# Patient Record
Sex: Female | Born: 1970 | Race: White | Hispanic: No | Marital: Married | Smoking: Former smoker
Health system: Southern US, Community
[De-identification: ages and names within clinical notes are randomized; demographics above are authoritative.]

## PROBLEM LIST (undated history)

## (undated) ENCOUNTER — Inpatient Hospital Stay (HOSPITAL_COMMUNITY): Payer: Self-pay

## (undated) DIAGNOSIS — I471 Supraventricular tachycardia, unspecified: Secondary | ICD-10-CM

## (undated) DIAGNOSIS — R011 Cardiac murmur, unspecified: Secondary | ICD-10-CM

## (undated) DIAGNOSIS — O139 Gestational [pregnancy-induced] hypertension without significant proteinuria, unspecified trimester: Secondary | ICD-10-CM

## (undated) DIAGNOSIS — R569 Unspecified convulsions: Secondary | ICD-10-CM

## (undated) DIAGNOSIS — R002 Palpitations: Secondary | ICD-10-CM

## (undated) HISTORY — PX: TONSILLECTOMY: SUR1361

## (undated) HISTORY — DX: Palpitations: R00.2

## (undated) HISTORY — DX: Supraventricular tachycardia, unspecified: I47.10

## (undated) HISTORY — DX: Supraventricular tachycardia: I47.1

---

## 1992-08-17 DIAGNOSIS — R569 Unspecified convulsions: Secondary | ICD-10-CM

## 1992-08-17 HISTORY — DX: Unspecified convulsions: R56.9

## 2011-08-18 NOTE — L&D Delivery Note (Signed)
Delivery Note At 9:32 PM a viable female was delivered via Vaginal, Spontaneous Delivery (Presentation: Left Occiput Anterior).  APGAR: pending , ; weight . 8lbs, 15oz. Placenta status: Intact, Spontaneous.  Cord:  with the following complications: Chorioamnionitis, minimal variability during day of delivery.  NICU team was at the delivery.  Good tone and heart rate without immediate respirations after delivery.  Cord pH: pending.  Anesthesia: Epidural  Episiotomy: None Lacerations: 2nd degree perineal and sulcus Suture Repair: 3.0 vicryl rapie Est. Blood Loss (mL): 300  Mom to postpartum.  Baby to NICU.  Placenta to pathology.  Breast, bottle feeding.  BTL tomorrow.  NPO after midnight tonight.  Sonia Side 05/18/2012, 10:12 PM

## 2011-08-18 NOTE — L&D Delivery Note (Signed)
I was present for and assisted with the delivery and agree with above. Light MSF present w/ terminal mec noted at delivery.   [place], CNM 05/21/2012 4:46 PM

## 2011-10-07 ENCOUNTER — Encounter (HOSPITAL_COMMUNITY): Payer: Self-pay

## 2011-10-07 ENCOUNTER — Inpatient Hospital Stay (HOSPITAL_COMMUNITY)
Admission: AD | Admit: 2011-10-07 | Discharge: 2011-10-07 | Disposition: A | Discharge status: Home / Self Care | Payer: Medicaid Other | Source: Ambulatory Visit | Attending: Family Medicine | Admitting: Family Medicine

## 2011-10-07 DIAGNOSIS — O210 Mild hyperemesis gravidarum: Secondary | ICD-10-CM | POA: Insufficient documentation

## 2011-10-07 DIAGNOSIS — O219 Vomiting of pregnancy, unspecified: Secondary | ICD-10-CM

## 2011-10-07 DIAGNOSIS — O26899 Other specified pregnancy related conditions, unspecified trimester: Secondary | ICD-10-CM

## 2011-10-07 DIAGNOSIS — R109 Unspecified abdominal pain: Secondary | ICD-10-CM

## 2011-10-07 HISTORY — DX: Cardiac murmur, unspecified: R01.1

## 2011-10-07 LAB — WET PREP, GENITAL
Trich, Wet Prep: NONE SEEN
Yeast Wet Prep HPF POC: NONE SEEN

## 2011-10-07 LAB — URINALYSIS, ROUTINE W REFLEX MICROSCOPIC
Bilirubin Urine: NEGATIVE
Glucose, UA: NEGATIVE mg/dL
Hgb urine dipstick: NEGATIVE
Specific Gravity, Urine: 1.025 (ref 1.005–1.030)
Urobilinogen, UA: 0.2 mg/dL (ref 0.0–1.0)
pH: 6 (ref 5.0–8.0)

## 2011-10-07 LAB — BASIC METABOLIC PANEL
BUN: 12 mg/dL (ref 6–23)
CO2: 25 mEq/L (ref 19–32)
Calcium: 9.3 mg/dL (ref 8.4–10.5)
Chloride: 101 mEq/L (ref 96–112)
Creatinine, Ser: 0.51 mg/dL (ref 0.50–1.10)
GFR calc Af Amer: >90 mL/min (ref >90)

## 2011-10-07 LAB — CBC
HCT: 37.4 % (ref 36.0–46.0)
MCH: 29 pg (ref 26.0–34.0)
MCV: 86.8 fL (ref 78.0–100.0)
RDW: 12.5 % (ref 11.5–15.5)
WBC: 11.1 10*3/uL — ABNORMAL HIGH (ref 4.0–10.5)

## 2011-10-07 LAB — DIFFERENTIAL
Basophils Absolute: 0 10*3/uL (ref 0.0–0.1)
Eosinophils Absolute: 0.1 10*3/uL (ref 0.0–0.7)
Eosinophils Relative: 1 % (ref 0–5)
Lymphocytes Relative: 27 % (ref 12–46)
Monocytes Absolute: 1.2 10*3/uL — ABNORMAL HIGH (ref 0.1–1.0)

## 2011-10-07 LAB — POCT PREGNANCY, URINE: Preg Test, Ur: POSITIVE — AB

## 2011-10-07 LAB — PREGNANCY, URINE: Preg Test, Ur: POSITIVE — AB

## 2011-10-07 MED ORDER — ONDANSETRON 8 MG PO TBDP: 8.0000 mg | ORAL_TABLET | Freq: Three times a day (TID) | ORAL | Status: DC | PRN

## 2011-10-07 MED ORDER — PROMETHAZINE HCL 25 MG PO TABS: 12.5000 mg | ORAL_TABLET | Freq: Four times a day (QID) | ORAL | Status: DC | PRN

## 2011-10-07 MED ORDER — ACETAMINOPHEN 325 MG PO TABS
650.0000 mg | ORAL_TABLET | Freq: Once | ORAL | Status: DC
  Filled 2011-10-07: qty 2

## 2011-10-07 MED ORDER — ONDANSETRON HCL 4 MG/2ML IJ SOLN: 4.0000 mg | Freq: Once | INTRAMUSCULAR | Status: DC

## 2011-10-07 MED ORDER — ONDANSETRON 8 MG PO TBDP
8.0000 mg | ORAL_TABLET | Freq: Once | ORAL | Status: AC
  Administered 2011-10-07: 8 mg via ORAL
  Filled 2011-10-07: qty 1

## 2011-10-07 NOTE — ED Provider Notes (Signed)
History     CSN: [id number]  Arrival date & time 10/07/11  1642   None     Chief Complaint  Patient presents with  . Nausea  . Back Pain   HPI: Connie Greer is a 41 y.o. female @ [redacted]w[redacted]d gestation who presents to [facility] for abdominal cramping in early pregnancy. She is also concerned about her age. Her last pregnancy was 18 years ago. She describes the pain as cramping like a period. She denies bleeding or vaginal discharge. The history was provided by the patient.  Past Medical History  Diagnosis Date  . No pertinent past medical history   . Heart murmur     Past Surgical History  Procedure Date  . No past surgeries     Family History  Problem Relation Age of Onset  . Hypertension Mother   . Cancer Mother   . Hypertension Father     History  Substance Use Topics  . Smoking status: Former Games developer  . Smokeless tobacco: Not on file  . Alcohol Use: No    OB History    Grav Para Term Preterm Abortions TAB SAB Ect Mult Living   2 1 1  0 0 0 0 0 0 1      Review of Systems  Constitutional: Positive for chills, appetite change and fatigue. Negative for fever and diaphoresis.  HENT: Positive for congestion. Negative for ear pain, sore throat, facial swelling, neck pain, neck stiffness, dental problem and sinus pressure.   Eyes: Negative for photophobia, pain and discharge.  Respiratory: Negative for cough, chest tightness and wheezing.   Cardiovascular: Negative.   Gastrointestinal: Positive for nausea, vomiting and abdominal pain. Negative for diarrhea, constipation and abdominal distention.  Genitourinary: Positive for vaginal discharge. Negative for dysuria, urgency, frequency, flank pain, vaginal bleeding, difficulty urinating and dyspareunia.  Musculoskeletal: Positive for back pain. Negative for myalgias and gait problem.  Skin: Positive for rash (left leg). Negative for color change.  Neurological: Positive for light-headedness and headaches. Negative for dizziness,  speech difficulty, weakness and numbness.  Psychiatric/Behavioral: Negative for confusion and agitation. The patient is not nervous/anxious.     Allergies  Review of patient's allergies indicates not on file.  Home Medications  No current outpatient prescriptions on file.  BP 102/63  Pulse 76  Temp(Src) 99.3 F (37.4 C) (Oral)  Resp 16  Ht 5\' 2"  (1.575 m)  Wt 125 lb 8 oz (56.926 kg)  BMI 22.95 kg/m2  LMP 08/03/2011  Physical Exam  Nursing note and vitals reviewed. Constitutional: She is oriented to person, place, and time. She appears well-developed and well-nourished. No distress.  HENT:  Head: Normocephalic.  Eyes: EOM are normal.  Neck: Neck supple.  Cardiovascular: Normal rate.   Pulmonary/Chest: Effort normal.  Abdominal: Soft. There is no tenderness.  Genitourinary:       External genitalia with out lesions. White discharge vaginal vault, cervix closed, long, no CMT, mildly tender bilateral adnexa. Uterus approximately 10 week size.   Musculoskeletal: Normal range of motion.  Neurological: She is alert and oriented to person, place, and time. No cranial nerve deficit.  Skin: Skin is warm and dry.  Psychiatric: She has a normal mood and affect. Her behavior is normal. Judgment and thought content normal.   Results for orders placed during the hospital encounter of 10/07/11 (from the past 24 hour(s))  URINALYSIS, ROUTINE W REFLEX MICROSCOPIC     Status: Normal   Collection Time   10/07/11  5:48 PM  Component Value Range   Color, Urine YELLOW  YELLOW    APPearance CLEAR  CLEAR    Specific Gravity, Urine 1.025  1.005 - 1.030    pH 6.0  5.0 - 8.0    Glucose, UA NEGATIVE  NEGATIVE (mg/dL)   Hgb urine dipstick NEGATIVE  NEGATIVE    Bilirubin Urine NEGATIVE  NEGATIVE    Ketones, ur NEGATIVE  NEGATIVE (mg/dL)   Protein, ur NEGATIVE  NEGATIVE (mg/dL)   Urobilinogen, UA 0.2  0.0 - 1.0 (mg/dL)   Nitrite NEGATIVE  NEGATIVE    Leukocytes, UA NEGATIVE  NEGATIVE     PREGNANCY, URINE     Status: Abnormal   Collection Time   10/07/11  5:48 PM      Component Value Range   Preg Test, Ur POSITIVE (*) NEGATIVE   POCT PREGNANCY, URINE     Status: Abnormal   Collection Time   10/07/11  6:03 PM      Component Value Range   Preg Test, Ur POSITIVE (*) NEGATIVE   WET PREP, GENITAL     Status: Abnormal   Collection Time   10/07/11  6:40 PM      Component Value Range   Yeast Wet Prep HPF POC NONE SEEN  NONE SEEN    Trich, Wet Prep NONE SEEN  NONE SEEN    Clue Cells Wet Prep HPF POC FEW (*) NONE SEEN    WBC, Wet Prep HPF POC FEW (*) NONE SEEN   CBC     Status: Abnormal   Collection Time   10/07/11  6:54 PM      Component Value Range   WBC 11.1 (*) 4.0 - 10.5 (K/uL)   RBC 4.31  3.87 - 5.11 (MIL/uL)   Hemoglobin 12.5  12.0 - 15.0 (g/dL)   HCT 40.9  81.1 - 91.4 (%)   MCV 86.8  78.0 - 100.0 (fL)   MCH 29.0  26.0 - 34.0 (pg)   MCHC 33.4  30.0 - 36.0 (g/dL)   RDW 78.2  95.6 - 21.3 (%)   Platelets 311  150 - 400 (K/uL)  DIFFERENTIAL     Status: Abnormal   Collection Time   10/07/11  6:54 PM      Component Value Range   Neutrophils Relative 61  43 - 77 (%)   Neutro Abs 6.8  1.7 - 7.7 (K/uL)   Lymphocytes Relative 27  12 - 46 (%)   Lymphs Abs 3.0  0.7 - 4.0 (K/uL)   Monocytes Relative 11  3 - 12 (%)   Monocytes Absolute 1.2 (*) 0.1 - 1.0 (K/uL)   Eosinophils Relative 1  0 - 5 (%)   Eosinophils Absolute 0.1  0.0 - 0.7 (K/uL)   Basophils Relative 0  0 - 1 (%)   Basophils Absolute 0.0  0.0 - 0.1 (K/uL)  BASIC METABOLIC PANEL     Status: Abnormal   Collection Time   10/07/11  6:54 PM      Component Value Range   Sodium 134 (*) 135 - 145 (mEq/L)   Potassium 3.8  3.5 - 5.1 (mEq/L)   Chloride 101  96 - 112 (mEq/L)   CO2 25  19 - 32 (mEq/L)   Glucose, Bld 87  70 - 99 (mg/dL)   BUN 12  6 - 23 (mg/dL)   Creatinine, Ser 0.86  0.50 - 1.10 (mg/dL)   Calcium 9.3  8.4 - 57.8 (mg/dL)   GFR calc non Af Amer >90  >  90 (mL/min)   GFR calc Af Amer >90  >90 (mL/min)    Informal bedside ultrasound shows IUP with cardiac activity. Patient able to visualize heart beat. Pictures given to patient.  Positive FHT by doppler 154.  Assessment: Discomforts of pregnancy   Nausea in first trimester pregnancy  Plan:  Phenergan Rx   Start prenatal care   Pregnancy verification letter    ED Course: Dr. Emelda Fear in to talk with patient re: concerns of risk factors associated with advanced maternal age.  Procedures  MDM          Kerrie Buffalo, NP 10/07/11 1954

## 2011-10-07 NOTE — Discharge Instructions (Signed)
    ________________________________________     To schedule your Maternity Eligibility Appointment, please call [phone].  When you arrive for your appointment you must bring the following items or information listed below.  Your appointment will be rescheduled if you do not have these items or are 15 minutes late. If currently receiving Medicaid, you MUST bring: 1. Medicaid Card 2. Social Security Card 3. Picture ID 4. Proof of Pregnancy 5. Verification of current address if the address on Medicaid card is incorrect "postmarked mail" If not receiving Medicaid, you MUST bring: 1. Social Security Card 2. Picture ID 3. Birth Certificate (if available) Passport or *Green Card 4. Proof of Pregnancy 5. Verification of current address "postmarked mail" for each income presented. 6. Verification of insurance coverage, if any 7. Check stubs from each employer for the previous month (if unable to present check stub  for each week, we will accept check stub for the first and last week ill the same month.) If you can't locate check stubs, you must bring a letter from the employer(s) and it must have the following information on letterhead, typed, in English: o name of company o company telephone number o how long been with the company, if less than one month o how much person earns per hour o how many hours per week work o the gross pay the person earned for the previous month If you are 40 years old or less, you do not have to bring proof of income unless you work or live with the father of the baby and at that time we will need proof of income from you and/or the father of the baby. Green Card recipients are eligible for Medicaid for Pregnant Women (MPW)    

## 2011-10-07 NOTE — Progress Notes (Signed)
Pt states nausea since beginning of pregnancy-6 weeks. LMP-08/03/2011, denies bleeding, back pain both sides, feels like her bones hurt. Deneis abnormal vaginal d/c.

## 2011-10-07 NOTE — Progress Notes (Signed)
Pt has been having cramps for about 6 wks

## 2011-10-08 LAB — GC/CHLAMYDIA PROBE AMP, GENITAL
Chlamydia, DNA Probe: NEGATIVE
GC Probe Amp, Genital: NEGATIVE

## 2011-10-11 NOTE — ED Provider Notes (Signed)
Attestation of Attending Supervision of Advanced Practitioner: Evaluation and management procedures were performed by the PA/NP/CNM/OB Fellow under my supervision/collaboration. Chart reviewed and agree with management and plan. Risks associated with advanced mat age reviewed with patient due to patient concerns.  Tilda Burrow 10/11/2011 8:56 PM

## 2011-10-14 ENCOUNTER — Encounter (HOSPITAL_COMMUNITY): Payer: Self-pay

## 2011-10-14 ENCOUNTER — Inpatient Hospital Stay (HOSPITAL_COMMUNITY)
Admission: AD | Admit: 2011-10-14 | Discharge: 2011-10-14 | Disposition: A | Discharge status: Home / Self Care | Payer: Medicaid Other | Source: Ambulatory Visit | Attending: Obstetrics & Gynecology | Admitting: Obstetrics & Gynecology

## 2011-10-14 ENCOUNTER — Inpatient Hospital Stay (HOSPITAL_COMMUNITY): Payer: Medicaid Other

## 2011-10-14 DIAGNOSIS — O26859 Spotting complicating pregnancy, unspecified trimester: Secondary | ICD-10-CM | POA: Insufficient documentation

## 2011-10-14 DIAGNOSIS — O209 Hemorrhage in early pregnancy, unspecified: Secondary | ICD-10-CM

## 2011-10-14 DIAGNOSIS — R109 Unspecified abdominal pain: Secondary | ICD-10-CM | POA: Insufficient documentation

## 2011-10-14 DIAGNOSIS — N898 Other specified noninflammatory disorders of vagina: Secondary | ICD-10-CM | POA: Insufficient documentation

## 2011-10-14 DIAGNOSIS — Z87891 Personal history of nicotine dependence: Secondary | ICD-10-CM | POA: Insufficient documentation

## 2011-10-14 HISTORY — DX: Gestational (pregnancy-induced) hypertension without significant proteinuria, unspecified trimester: O13.9

## 2011-10-14 LAB — WET PREP, GENITAL: WBC, Wet Prep HPF POC: NONE SEEN

## 2011-10-14 LAB — URINALYSIS, ROUTINE W REFLEX MICROSCOPIC
Bilirubin Urine: NEGATIVE
Glucose, UA: NEGATIVE mg/dL
Ketones, ur: NEGATIVE mg/dL
pH: 6 (ref 5.0–8.0)

## 2011-10-14 LAB — CBC
Platelets: 302 10*3/uL (ref 150–400)
RDW: 12.5 % (ref 11.5–15.5)
WBC: 10.6 10*3/uL — ABNORMAL HIGH (ref 4.0–10.5)

## 2011-10-14 MED ORDER — RHO D IMMUNE GLOBULIN 1500 UNIT/2ML IJ SOLN
300.0000 ug | Freq: Once | INTRAMUSCULAR | Status: AC
  Administered 2011-10-14: 300 ug via INTRAMUSCULAR
  Filled 2011-10-14: qty 2

## 2011-10-14 NOTE — ED Provider Notes (Signed)
Attestation of Attending Supervision of Advanced Practitioner: Evaluation and management procedures were performed by the PA/NP/CNM/OB Fellow under my supervision/collaboration. Chart reviewed, and agree with management and plan.  Jaynie Collins, M.D. 10/14/2011 1:32 PM

## 2011-10-14 NOTE — ED Provider Notes (Signed)
History   Pt presents today c/o vag spotting for the past couple of days. She states her last episode of intercourse was on Saturday. She also c/o mild lower abd pain. She denies vag dc or irritation.  Chief Complaint  Patient presents with  . Abdominal Pain   HPI  OB History    Grav Para Term Preterm Abortions TAB SAB Ect Mult Living   2 1 0 1 0 0 0 0 0 1       Past Medical History  Diagnosis Date  . Heart murmur   . Pregnancy induced hypertension   . Preterm labor     Past Surgical History  Procedure Date  . Tonsillectomy     Family History  Problem Relation Age of Onset  . Hypertension Mother   . Cancer Mother   . Hypertension Father   . Anesthesia problems Neg Hx     History  Substance Use Topics  . Smoking status: Former Games developer  . Smokeless tobacco: Never Used  . Alcohol Use: No    Allergies: No Known Allergies  Prescriptions prior to admission  Medication Sig Dispense Refill  . acetaminophen (TYLENOL) 500 MG tablet Take 1,000 mg by mouth every 6 (six) hours as needed. For pain      . ondansetron (ZOFRAN ODT) 8 MG disintegrating tablet Take 1 tablet (8 mg total) by mouth every 8 (eight) hours as needed for nausea.  20 tablet  0  . Prenatal Vit-Fe Fumarate-FA (PRENATAL MULTIVITAMIN) TABS Take 1 tablet by mouth daily.      . promethazine (PHENERGAN) 25 MG tablet Take 0.5 tablets (12.5 mg total) by mouth every 6 (six) hours as needed for nausea.  20 tablet  0    Review of Systems  Constitutional: Negative for fever and chills.  Eyes: Negative for blurred vision and double vision.  Cardiovascular: Negative for chest pain and palpitations.  Gastrointestinal: Positive for abdominal pain. Negative for nausea, vomiting, diarrhea and constipation.  Genitourinary: Negative for dysuria, urgency, frequency and hematuria.  Neurological: Negative for dizziness and headaches.  Psychiatric/Behavioral: Negative for depression and suicidal ideas.   Physical Exam    Blood pressure 90/58, pulse 86, temperature 98.4 F (36.9 C), temperature source Oral, resp. rate 16, height 5\' 2"  (1.575 m), weight 126 lb 9.6 oz (57.425 kg), last menstrual period 08/03/2011, SpO2 100.00%.  Physical Exam  Nursing note and vitals reviewed. Constitutional: She is oriented to person, place, and time. She appears well-developed and well-nourished. No distress.  HENT:  Head: Normocephalic and atraumatic.  Eyes: EOM are normal. Pupils are equal, round, and reactive to light.  GI: Soft. She exhibits no distension and no mass. There is no tenderness. There is no rebound and no guarding.  Genitourinary: No bleeding around the vagina. Vaginal discharge found.       Cervix Lg/closed. Uterus 10wks size and slightly tender to palpation. No adnexal masses. No bleeding noted on exam.  Neurological: She is alert and oriented to person, place, and time.  Skin: Skin is warm and dry. She is not diaphoretic.  Psychiatric: She has a normal mood and affect. Her behavior is normal. Judgment and thought content normal.    [facility] Course  Procedures  Wet prep and GC/Chalmydia cultures done.  Results for orders placed during the hospital encounter of 10/14/11 (from the past 24 hour(s))  URINALYSIS, ROUTINE W REFLEX MICROSCOPIC     Status: Normal   Collection Time   10/14/11  9:05 AM  Component Value Range   Color, Urine YELLOW  YELLOW    APPearance CLEAR  CLEAR    Specific Gravity, Urine 1.025  1.005 - 1.030    pH 6.0  5.0 - 8.0    Glucose, UA NEGATIVE  NEGATIVE (mg/dL)   Hgb urine dipstick NEGATIVE  NEGATIVE    Bilirubin Urine NEGATIVE  NEGATIVE    Ketones, ur NEGATIVE  NEGATIVE (mg/dL)   Protein, ur NEGATIVE  NEGATIVE (mg/dL)   Urobilinogen, UA 0.2  0.0 - 1.0 (mg/dL)   Nitrite NEGATIVE  NEGATIVE    Leukocytes, UA NEGATIVE  NEGATIVE   WET PREP, GENITAL     Status: Abnormal   Collection Time   10/14/11  9:35 AM      Component Value Range   Yeast Wet Prep HPF POC NONE SEEN  NONE  SEEN    Trich, Wet Prep NONE SEEN  NONE SEEN    Clue Cells Wet Prep HPF POC FEW (*) NONE SEEN    WBC, Wet Prep HPF POC NONE SEEN  NONE SEEN   CBC     Status: Abnormal   Collection Time   10/14/11  9:40 AM      Component Value Range   WBC 10.6 (*) 4.0 - 10.5 (K/uL)   RBC 4.31  3.87 - 5.11 (MIL/uL)   Hemoglobin 12.7  12.0 - 15.0 (g/dL)   HCT 21.3  08.6 - 57.8 (%)   MCV 87.2  78.0 - 100.0 (fL)   MCH 29.5  26.0 - 34.0 (pg)   MCHC 33.8  30.0 - 36.0 (g/dL)   RDW 46.9  62.9 - 52.8 (%)   Platelets 302  150 - 400 (K/uL)    US shows single IUP at 10.2wks with good cardiac activity. Small subchorionic hemorrhage noted. Assessment and Plan  Vag bleeding in preg: discussed with pt at length. Pt to receive rhophylac injection prior to dc. She will f/u with her OB provider. Discussed diet, activity, risks, and precautions.  Clinton Gallant. Sharron Petruska III, DrHSc, MPAS, PA-C  10/14/2011, 9:44 AM   Henrietta Hoover, PA 10/14/11 [phone]

## 2011-10-14 NOTE — Discharge Instructions (Signed)
Vaginal Bleeding During Pregnancy, First Trimester A small amount of bleeding (spotting) is relatively common in early pregnancy. It usually stops on its own. There are many causes for bleeding or spotting in early pregnancy. Some bleeding may be related to the pregnancy and some may not. Cramping with the bleeding is more serious and concerning. Tell your caregiver if you have any vaginal bleeding.  CAUSES   It is normal in most cases.   The pregnancy ends (miscarriage).   The pregnancy may end (threatened miscarriage).   Infection or inflammation of the cervix.   Growths (polyps) on the cervix.   Pregnancy happens outside of the uterus and in a fallopian tube (tubal pregnancy).   Many tiny cysts in the uterus instead of pregnancy tissue (molar pregnancy).  SYMPTOMS  Vaginal bleeding or spotting with or without cramps. DIAGNOSIS  To evaluate the pregnancy, your caregiver may:  Do a pelvic exam.   Take blood tests.   Do an ultrasound.  It is very important to follow your caregiver's instructions.  TREATMENT   Evaluation of the pregnancy with blood tests and ultrasound.   Bed rest (getting up to use the bathroom only).   Rho-gam immunization if the mother is Rh negative and the father is Rh positive.  HOME CARE INSTRUCTIONS   If your caregiver orders bed rest, you may need to make arrangements for the care of other children and for other responsibilities. However, your caregiver may allow you to continue light activity.   Keep track of the number of pads you use each day, how often you change pads and how soaked (saturated) they are. Write this down.   Do not use tampons. Do not douche.   Do not have sexual intercourse or orgasms until approved by your physician.   Save any tissue that you pass for your caregiver to see.   Take medicine for cramps only with your caregiver's permission.   Do not take aspirin because it can make you bleed.  SEEK IMMEDIATE MEDICAL CARE  IF:   You experience severe cramps in your stomach, back or belly (abdomen).   You have an oral temperature above 102 F (38.9 C), not controlled by medicine.   You pass large clots or tissue.   Your bleeding increases or you become light-headed, weak or have fainting episodes.   You develop chills.   You are leaking or have a gush of fluid from your vagina.   You pass out while having a bowel movement. That may mean you have a ruptured tubal pregnancy.  Document Released: 05/13/2005 Document Revised: 04/15/2011 Document Reviewed: 11/22/2008 Exit[employer] Patient Information 2012 [employer]. 

## 2011-10-14 NOTE — Progress Notes (Signed)
Patient states that she has not been feeling well for the entire pregnancy, has nausea but no vomiting, lower abdominal and low back cramping and started spotting this am. Patient is not wearing a pad and states bright red spotting only with wiping.

## 2011-10-14 NOTE — Progress Notes (Signed)
Pt states noted spotting around 0530, only with wiping, having lower abd cramping as well. Last intercourse this weekend. Denies uti s/s.

## 2011-10-14 NOTE — ED Notes (Signed)
No adverse effect from Rhophylac. Info pamphlet given.

## 2011-10-15 LAB — GC/CHLAMYDIA PROBE AMP, GENITAL
Chlamydia, DNA Probe: NEGATIVE
GC Probe Amp, Genital: NEGATIVE

## 2011-10-15 LAB — RH IG WORKUP (INCLUDES ABO/RH)
ABO/RH(D): B NEG
Antibody Screen: NEGATIVE
Gestational Age(Wks): 10.2

## 2011-10-22 ENCOUNTER — Inpatient Hospital Stay (HOSPITAL_COMMUNITY)
Admission: AD | Admit: 2011-10-22 | Discharge: 2011-10-22 | Disposition: A | Discharge status: Home / Self Care | Payer: Medicaid Other | Source: Ambulatory Visit | Attending: Obstetrics & Gynecology | Admitting: Obstetrics & Gynecology

## 2011-10-22 ENCOUNTER — Encounter (HOSPITAL_COMMUNITY): Payer: Self-pay

## 2011-10-22 DIAGNOSIS — O200 Threatened abortion: Secondary | ICD-10-CM | POA: Insufficient documentation

## 2011-10-22 NOTE — Progress Notes (Signed)
Pt states felt a gush of blood while at work. States no cramping with the bleeding. Bright red blood, no clots.

## 2011-10-22 NOTE — Discharge Instructions (Signed)
Pelvic Rest Pelvic rest is sometimes recommended for women when:   The placenta is partially or completely covering the opening of the cervix (placenta previa).   There is bleeding between the uterine wall and the amniotic sac in the first trimester (subchorionic hemorrhage).   The cervix begins to open without labor starting (incompetent cervix, cervical insufficiency).   The labor is too early (preterm labor).  HOME CARE INSTRUCTIONS  Do not have sexual intercourse, stimulation, or an orgasm.   Do not use tampons, douche, or put anything in the vagina.   Do not lift anything over 10 pounds (4.5 kg).   Avoid strenuous activity or straining your pelvic muscles.  SEEK MEDICAL CARE IF:  You have any vaginal bleeding during pregnancy. Treat this as a potential emergency.   You have cramping pain felt low in the stomach (stronger than menstrual cramps).   You notice vaginal discharge (watery, mucus, or bloody).   You have a low, dull backache.   There are regular contractions or uterine tightening.  SEEK IMMEDIATE MEDICAL CARE IF: You have vaginal bleeding and have placenta previa.  Document Released: 11/28/2010 Document Revised: 07/23/2011 Document Reviewed: 11/28/2010 [employer] Patient Information 2012 [employer].Threatened Miscarriage  A threatened miscarriage is a pregnancy that may end. It may be marked by bleeding during the first 20 weeks of pregnancy. Often, the pregnancy can continue without any more problems. You may be asked to stop:  Having sex (intercourse).   Having orgasms.   Using tampons.   Exercising.   Doing heavy physical activity and work.  HOME CARE   Your doctor may tell you to take bed rest and to stop activities and work.   Write down the number of pads you use each day. Write down how often you change pads. Write down how soaked they are.   Follow your doctor's advice for follow-up visits and tests.   If your blood type is Rh-negative  and the father's blood is Rh-positive (or is not known), you may get a shot to protect the baby.   If you have a miscarriage, save all the tissue you pass in a container. Take the container to your doctor.  GET HELP RIGHT AWAY IF:   You have bad cramps or pain in your belly (abdomen), lower belly, or back.   You have a fever or chills.   Your bleeding gets worse or you pass large clots of blood or tissue. Save this tissue to show your doctor.   You feel lightheaded, weak, dizzy, or pass out (faint).   You have a gush of fluid from your vagina.  MAKE SURE YOU:   Understand these instructions.   Will watch your condition.   Will get help right away if you are not doing well or get worse.  Document Released: 07/16/2008 Document Revised: 07/23/2011 Document Reviewed: 08/19/2009 Exit[employer] Patient Information 2012 [employer]. 

## 2011-10-22 NOTE — ED Provider Notes (Signed)
History     CSN: [id number]  Arrival date & time 10/22/11  1331   None     No chief complaint on file.   HPI Connie Greer is a 41 y.o. female who presents to [facility] for vaginal bleeding. Was evaluated here last week for same but today bleeding has increased. Mild cramping in lower abdomen and back. Had Rhogam at last visit re: Rh negative.  Past Medical History  Diagnosis Date  . Heart murmur   . Pregnancy induced hypertension   . Preterm labor     Past Surgical History  Procedure Date  . Tonsillectomy     Family History  Problem Relation Age of Onset  . Hypertension Mother   . Cancer Mother   . Hypertension Father   . Anesthesia problems Neg Hx     History  Substance Use Topics  . Smoking status: Former Games developer  . Smokeless tobacco: Never Used  . Alcohol Use: No    OB History    Grav Para Term Preterm Abortions TAB SAB Ect Mult Living   2 1 0 1 0 0 0 0 0 1       Review of Systems  Constitutional: Negative for fever, chills, diaphoresis and fatigue.  HENT: Negative for ear pain, congestion, sore throat, facial swelling, neck pain, neck stiffness, dental problem and sinus pressure.   Eyes: Negative for photophobia, pain and discharge.  Respiratory: Negative for cough, chest tightness and wheezing.   Cardiovascular: Negative.   Gastrointestinal: Positive for nausea. Negative for vomiting, abdominal pain (cramping), diarrhea, constipation and abdominal distention.  Genitourinary: Positive for vaginal bleeding. Negative for dysuria, frequency, flank pain, vaginal discharge and difficulty urinating.  Musculoskeletal: Positive for back pain. Negative for myalgias and gait problem.  Skin: Positive for rash. Negative for color change.  Neurological: Positive for headaches. Negative for dizziness, speech difficulty, weakness, light-headedness and numbness.  Psychiatric/Behavioral: Negative for confusion and agitation.    Allergies  Review of patient's allergies  indicates no known allergies.  Home Medications  No current outpatient prescriptions on file.  BP 108/67  Pulse 97  Temp(Src) 98.4 F (36.9 C) (Oral)  Resp 20  SpO2 100%  LMP 08/03/2011  Physical Exam  Nursing note and vitals reviewed. Constitutional: She is oriented to person, place, and time. She appears well-developed and well-nourished.  HENT:  Head: Normocephalic.  Eyes: EOM are normal.  Neck: Neck supple.  Cardiovascular: Normal rate.   Pulmonary/Chest: Effort normal.  Abdominal: Soft. There is no tenderness.  Genitourinary:       Small amount of blood vaginal vault. Cervix closed, long, no CMT. Uterus 12 week size.  Musculoskeletal: Normal range of motion.  Neurological: She is alert and oriented to person, place, and time. No cranial nerve deficit.  Skin: Skin is warm and dry.  Psychiatric: She has a normal mood and affect. Her behavior is normal. Judgment and thought content normal.   Assessment: Threatened AB  Plan:  Instructions to patient re pelvic rest   Start prenatal care ED Course  Procedures: Informal bedside ultrasound shows active IUP with cardiac activity. Patient able to visualize heart beat. Doppler FHT 160   MDM   [place], NP 10/22/11 1449  [place], NP 10/22/11 1454

## 2011-10-24 ENCOUNTER — Encounter (HOSPITAL_COMMUNITY): Payer: Self-pay | Admitting: *Deleted

## 2011-10-24 ENCOUNTER — Inpatient Hospital Stay (HOSPITAL_COMMUNITY): Payer: Medicaid Other

## 2011-10-24 ENCOUNTER — Inpatient Hospital Stay (HOSPITAL_COMMUNITY)
Admission: AD | Admit: 2011-10-24 | Discharge: 2011-10-24 | Disposition: A | Discharge status: Home / Self Care | Payer: Medicaid Other | Source: Ambulatory Visit | Attending: Obstetrics and Gynecology | Admitting: Obstetrics and Gynecology

## 2011-10-24 DIAGNOSIS — O209 Hemorrhage in early pregnancy, unspecified: Secondary | ICD-10-CM | POA: Insufficient documentation

## 2011-10-24 LAB — URINALYSIS, ROUTINE W REFLEX MICROSCOPIC
Glucose, UA: NEGATIVE mg/dL
Ketones, ur: NEGATIVE mg/dL
Leukocytes, UA: NEGATIVE
pH: 6 (ref 5.0–8.0)

## 2011-10-24 LAB — URINE MICROSCOPIC-ADD ON

## 2011-10-24 LAB — CBC
Hemoglobin: 12.7 g/dL (ref 12.0–15.0)
MCH: 29.6 pg (ref 26.0–34.0)
MCV: 86.2 fL (ref 78.0–100.0)
RBC: 4.29 MIL/uL (ref 3.87–5.11)

## 2011-10-24 MED ORDER — HYDROCODONE-ACETAMINOPHEN 5-500 MG PO TABS: 1.0000 | ORAL_TABLET | Freq: Four times a day (QID) | ORAL | Status: AC | PRN

## 2011-10-24 NOTE — Discharge Instructions (Signed)
Vaginal Bleeding During Pregnancy  A small amount of bleeding from the vagina can happen anytime during pregnancy. Be sure to tell your doctor about all vaginal bleeding.   HOME CARE   Get plenty of rest and sleep.   Count the number of pads you use each day. Do not use tampons.   Save any tissue you pass for your doctor to see.   Do not exercise   Do not do any heavy lifting.   Avoid going up and down stairs. If you must climb stairs, go slowly.   Do not have sex (intercourse) or orgasms until approved by your doctor.   Do not douche.   Only take medicine as told by your doctor. Do not take aspirin.   Eat healthy.   Always keep your follow-up appointments.  GET HELP RIGHT AWAY IF:    You feel the baby moving less or not moving at all.   The bleeding gets worse.   You have very painful cramps or pain in your stomach or back.   You pass large clots or anything that looks like tissue.   You have a temperature by mouth above 102 F (38.9 C).   You feel very weak.   You have chills.   You feel dizzy or pass out (faint).   You have a gush of fluid from the vagina.  MAKE SURE YOU:    Understand these instructions.   Will watch your condition.   Will get help right away if you are not doing well or get worse.  Document Released: 05/12/2008 Document Revised: 07/23/2011 Document Reviewed: 07/09/2009  ExitCare Patient Information 2012 [employer].

## 2011-10-24 NOTE — Progress Notes (Signed)
Henrietta Hoover PA in to discuss u/s results and d/c plan. Written and verbal d/c instructions given and understanding voiced

## 2011-10-24 NOTE — Progress Notes (Signed)
G2P1 at 14wks. Went to [facility] about 2345 and passed clot in toilet. Afterward only had blood on tissue when wiped

## 2011-10-24 NOTE — ED Provider Notes (Signed)
History   Pt presents today c/o increased vag bleeding and pain since around 10pm. She states she is now passing some clots and is very concerned. She denies recent intercourse. She denies fever or any other sx. She has a prior US which demonstrated a small subchorionic hemorrhage.  No chief complaint on file.  HPI  OB History    Grav Para Term Preterm Abortions TAB SAB Ect Mult Living   2 1 0 1 0 0 0 0 0 1       Past Medical History  Diagnosis Date  . Heart murmur   . Pregnancy induced hypertension   . Preterm labor     Past Surgical History  Procedure Date  . Tonsillectomy     Family History  Problem Relation Age of Onset  . Hypertension Mother   . Cancer Mother   . Hypertension Father   . Anesthesia problems Neg Hx     History  Substance Use Topics  . Smoking status: Former Games developer  . Smokeless tobacco: Never Used  . Alcohol Use: No    Allergies: No Known Allergies  Prescriptions prior to admission  Medication Sig Dispense Refill  . acetaminophen (TYLENOL) 500 MG tablet Take 1,000 mg by mouth every 6 (six) hours as needed. For pain      . ondansetron (ZOFRAN ODT) 8 MG disintegrating tablet Take 1 tablet (8 mg total) by mouth every 8 (eight) hours as needed for nausea.  20 tablet  0  . Prenatal Vit-Fe Fumarate-FA (PRENATAL MULTIVITAMIN) TABS Take 1 tablet by mouth daily.        Review of Systems  Constitutional: Negative for fever and chills.  Eyes: Negative for blurred vision and double vision.  Respiratory: Negative for cough, hemoptysis, sputum production, shortness of breath and wheezing.   Cardiovascular: Negative for chest pain and palpitations.  Gastrointestinal: Positive for abdominal pain. Negative for nausea, vomiting, diarrhea, constipation and blood in stool.  Genitourinary: Negative for dysuria, urgency, frequency and hematuria.  Neurological: Negative for dizziness and headaches.  Psychiatric/Behavioral: Negative for depression and suicidal  ideas.   Physical Exam   Blood pressure 106/65, pulse 76, temperature 98.5 F (36.9 C), temperature source Oral, resp. rate 20, height 5\' 2"  (1.575 m), weight 125 lb (56.7 kg), last menstrual period 08/03/2011.  Physical Exam  Nursing note and vitals reviewed. Constitutional: She is oriented to person, place, and time. She appears well-developed and well-nourished. No distress.  HENT:  Head: Normocephalic and atraumatic.  Eyes: EOM are normal. Pupils are equal, round, and reactive to light.  GI: Soft. She exhibits no distension and no mass. There is tenderness. There is no rebound and no guarding.  Genitourinary: There is bleeding around the vagina.       Cervix Lg/closed. Minimal amount of bright red blood noted on exam.  Neurological: She is alert and oriented to person, place, and time.  Skin: Skin is warm and dry. She is not diaphoretic.  Psychiatric: She has a normal mood and affect. Her behavior is normal. Judgment and thought content normal.    MAU Course  Procedures  Bedside US shows single IUP with cardiac activity and movement.  Results for orders placed during the hospital encounter of 10/24/11 (from the past 24 hour(s))  CBC     Status: Normal   Collection Time   10/24/11  1:04 AM      Component Value Range   WBC 10.5  4.0 - 10.5 (K/uL)   RBC 4.29  3.87 -  5.11 (MIL/uL)   Hemoglobin 12.7  12.0 - 15.0 (g/dL)   HCT 16.1  09.6 - 04.5 (%)   MCV 86.2  78.0 - 100.0 (fL)   MCH 29.6  26.0 - 34.0 (pg)   MCHC 34.3  30.0 - 36.0 (g/dL)   RDW 40.9  81.1 - 91.4 (%)   Platelets 278  150 - 400 (K/uL)   US Ob Comp Less 14 Wks  10/24/2011  *RADIOLOGY REPORT*  Clinical Data: Vaginal bleeding.  12 weeks 1 day pregnant by previous ultrasound.  OBSTETRIC <14 WK ULTRASOUND  Technique:  Transabdominal ultrasound was performed for evaluation of the gestation as well as the maternal uterus and adnexal regions.  Comparison:  10/14/2011.  Intrauterine gestational sac: Visualized/normal in  shape. Yolk sac: Visualized (small) Embryo: Visualized Cardiac Activity: Visualized Heart Rate: 149 bpm CRL:  54.2 mm  12w  1d        [place] EDC: 05/06/2012  Maternal uterus/Adnexae: Right ovarian corpus luteum cyst.  Normal appearing left ovary.  No subchorionic hemorrhage is seen at this time.  No free peritoneal fluid.  IMPRESSION: Single live intrauterine gestation with estimated gestational age of [redacted] weeks 1 day.  This represents normal growth since the previous examination.  No complicating features.  Original Report Authenticated By: Darrol Angel, M.D.     Assessment and Plan  Bleeding in preg: discussed with pt at length. Must consider as threatening to miscarry. Discussed diet, activity, risks, and precautions. Will give few lortab for pain. Discussed diet, activity, risks, and precautions.  Clinton Gallant. Maressa Apollo III, DrHSc, MPAS, PA-C  10/24/2011, 1:19 AM   Harmon [place], PA 10/24/11 7829

## 2011-10-24 NOTE — ED Notes (Signed)
Henrietta Hoover PA in to see pt. Portable, informal u/s by PA and cardiac activity present. Couple reassured.

## 2011-10-25 NOTE — ED Provider Notes (Signed)
Attestation of Attending Supervision of Advanced Practitioner: Evaluation and management procedures were performed by the PA/NP/CNM/OB Fellow under my supervision/collaboration. Chart reviewed and agree with management and plan.  Ragnar Waas V 10/25/2011 2:50 PM

## 2011-11-09 ENCOUNTER — Emergency Department (HOSPITAL_COMMUNITY): Payer: Medicaid Other

## 2011-11-09 ENCOUNTER — Other Ambulatory Visit: Payer: Self-pay

## 2011-11-09 ENCOUNTER — Emergency Department (HOSPITAL_COMMUNITY)
Admission: EM | Admit: 2011-11-09 | Discharge: 2011-11-09 | Disposition: A | Discharge status: Home / Self Care | Payer: Medicaid Other | Attending: Emergency Medicine | Admitting: Emergency Medicine

## 2011-11-09 ENCOUNTER — Encounter (HOSPITAL_COMMUNITY): Payer: Self-pay

## 2011-11-09 DIAGNOSIS — R000 Tachycardia, unspecified: Secondary | ICD-10-CM | POA: Insufficient documentation

## 2011-11-09 DIAGNOSIS — I471 Supraventricular tachycardia: Secondary | ICD-10-CM

## 2011-11-09 DIAGNOSIS — O2690 Pregnancy related conditions, unspecified, unspecified trimester: Secondary | ICD-10-CM | POA: Insufficient documentation

## 2011-11-09 LAB — BASIC METABOLIC PANEL
BUN: 10 mg/dL (ref 6–23)
GFR calc Af Amer: >90 mL/min (ref >90)
GFR calc non Af Amer: >90 mL/min (ref >90)
Potassium: 3.5 mEq/L (ref 3.5–5.1)
Sodium: 136 mEq/L (ref 135–145)

## 2011-11-09 LAB — CBC
MCHC: 34.6 g/dL (ref 30.0–36.0)
RDW: 13 % (ref 11.5–15.5)

## 2011-11-09 LAB — URINALYSIS, MICROSCOPIC ONLY
Bilirubin Urine: NEGATIVE
Nitrite: NEGATIVE
Specific Gravity, Urine: 1.014 (ref 1.005–1.030)
pH: 7 (ref 5.0–8.0)

## 2011-11-09 LAB — RAPID URINE DRUG SCREEN, HOSP PERFORMED
Barbiturates: NOT DETECTED
Cocaine: NOT DETECTED
Opiates: NOT DETECTED

## 2011-11-09 MED ORDER — SODIUM CHLORIDE 0.9 % IV SOLN
Freq: Once | INTRAVENOUS | Status: AC
  Administered 2011-11-09: 15:00:00 via INTRAVENOUS

## 2011-11-09 MED ORDER — MORPHINE SULFATE 4 MG/ML IJ SOLN
4.0000 mg | Freq: Once | INTRAMUSCULAR | Status: DC
  Filled 2011-11-09: qty 1

## 2011-11-09 MED ORDER — IOHEXOL 350 MG/ML SOLN
75.0000 mL | Freq: Once | INTRAVENOUS | Status: AC | PRN
  Administered 2011-11-09: 75 mL via INTRAVENOUS

## 2011-11-09 MED ORDER — ACETAMINOPHEN 325 MG PO TABS
650.0000 mg | ORAL_TABLET | Freq: Once | ORAL | Status: AC
  Administered 2011-11-09: 650 mg via ORAL
  Filled 2011-11-09: qty 2

## 2011-11-09 NOTE — Discharge Instructions (Signed)
Supraventricular Tachycardia  Supraventricular tachycardia (SVT) is an abnormal heart rhythm (arrhythmia) that causes the heart to beat very fast (tachycardia). This kind of fast heartbeat originates in the upper chambers of the heart (atria). SVT can cause the heart to beat greater than 100 beats per minute. SVT can have a rapid burst of heartbeats. This can start and stop suddenly without warning and is called nonsustained. SVT can also be sustained, in which the heart beats at a continuous fast rate.   CAUSES   There can be different causes of SVT. Some of these include:   Heart valve problems such as mitral valve prolapse.   An enlarged heart (hypertrophic cardiomyopathy).   Congenital heart problems.   Heart inflammation (pericarditis).   Hyperthyroidism.   Low potassium or magnesium levels.   Caffeine.   Drug use such as cocaine, methamphetamines, or stimulants.   Some over-the-counter medicines such as:   Decongestants.   Diet medicines.   Herbal medicines.  SYMPTOMS   Symptoms of SVT can vary. Symptoms depend on whether the SVT is sustained or nonsustained. You may experience:   No symptoms (asymptomatic).   An awareness of your heart beating rapidly (palpitations).   Shortness of breath.   Chest pain or pressure.  If your blood pressure drops because of the SVT, you may experience:   Fainting or near fainting.   Weakness.   Dizziness.  DIAGNOSIS   Different tests can be performed to diagnose SVT, such as:   An electrocardiogram (EKG). This is a painless test that records the electrical activity of your heart.   Holter monitor. This is a 24 hour recording of your heart rhythm. You will be given a diary. Write down all symptoms that you have and what you were doing at the time you experienced symptoms.   Arrhythmia monitor. This is a small device that your wear for several weeks. It records the heart rhythm when you have symptoms.   Echocardiogram. This is an imaging test to help detect  abnormal heart structure such as congenital abnormalities, heart valve problems, or heart enlargement.   Stress test. This test can help determine if the SVT is related to exercise.   Electrophysiology study (EPS). This is a procedure that evaluates your heart's electrical system and can help your caregiver find the cause of your SVT.  TREATMENT   Treatment of SVT depends on the symptoms, how often it recurs, and whether there are any underlying heart problems.    If symptoms are rare and no other cardiac disease is present, no treatment may be needed.   Blood work may be done to check potassium, magnesium, and thyroid hormone levels to see if they are abnormal. If these levels are abnormal, treatment to correct the problems will occur.  Medicines  Your caregiver may use oral medicines to treat SVT. These medicines are given for long-term control of SVT. Medicines may be used alone or in combination with other treatments. These medicines work to slow nerve impulses in the heart muscle. These medicines can also be used to treat high blood pressure. Some of these medicines may include:   Calcium channel blockers.   Beta blockers.   Digoxin.  Nonsurgical procedures  Nonsurgical techniques may be used if oral medicines do not work. Some examples include:   Cardioversion. This technique uses either drugs or an electrical shock to restore a normal heart rhythm.   Cardioversion drugs may be given through an intravenous (IV) line to help "reset" the   heart rhythm.   In electrical cardioversion, the caregiver shocks your heart to stop its beat for a split second. This helps to reset the heart to a normal rhythm.   Ablation. This procedure is done under mild sedation. High frequency radio wave energy is used to destroy the area of heart tissue responsible for the SVT.  HOME CARE INSTRUCTIONS    Do not smoke.   Only take medicines prescribed by your caregiver. Check with your caregiver before using over-the-counter  medicines.   Check with your caregiver about how much alcohol and caffeine (coffee, tea, colas, or chocolate) you may have.   It is very important to keep all follow-up referrals and appointments in order to properly manage this problem.  SEEK IMMEDIATE MEDICAL CARE IF:   You have dizziness.   You faint or nearly faint.   You have shortness of breath.   You have chest pain or pressure.   You have sudden nausea or vomiting.   You have profuse sweating.   You are concerned about how long your symptoms last.   You are concerned about the frequency of your SVT episodes.  If you have the above symptoms, call your local emergency services (911 in [place] immediately. Do not drive yourself to the hospital.  MAKE SURE YOU:    Understand these instructions.   Will watch your condition.   Will get help right away if you are not doing well or get worse.  Document Released: 08/03/2005 Document Revised: 07/23/2011 Document Reviewed: 11/15/2008  [employer] Patient Information 2012 [employer].

## 2011-11-09 NOTE — ED Provider Notes (Signed)
History     CSN: [id number]  Arrival date & time 11/09/11  1052   First MD Initiated Contact with Patient 11/09/11 1115      Chief Complaint  Patient presents with  . Tachycardia    SVT converted to sinus tachycardia    HPI The patient developed palpitations and generalized weakness this morning.   EMS was contacted and the patient was was given adenosine a route for noted SVT which is confirmed on strips presented to the ER.  This converted her back to normal sinus rhythm.  The patient reports she feels much better at this time.  She had no preceding chest pain or shortness of breath.  She's currently [redacted] weeks pregnant based on early first trimester ultrasound.  She's had no vaginal bleeding.  She denies abdominal pain.  At this time she is without complaints.  She has no prior history of SVT.  She has no prior history of DVT or pulmonary embolism.  Past Medical History  Diagnosis Date  . Heart murmur   . Pregnancy induced hypertension   . Preterm labor     Past Surgical History  Procedure Date  . Tonsillectomy     Family History  Problem Relation Age of Onset  . Hypertension Mother   . Cancer Mother   . Hypertension Father   . Anesthesia problems Neg Hx     History  Substance Use Topics  . Smoking status: Former Games developer  . Smokeless tobacco: Never Used  . Alcohol Use: No    OB History    Grav Para Term Preterm Abortions TAB SAB Ect Mult Living   2 1 0 1 0 0 0 0 0 1       Review of Systems  All other systems reviewed and are negative.    Allergies  Review of patient's allergies indicates no known allergies.  Home Medications   Current Outpatient Rx  Name Route Sig Dispense Refill  . ACETAMINOPHEN 500 MG PO TABS Oral Take 1,000 mg by mouth every 6 (six) hours as needed. For pain    . PRENATAL MULTIVITAMIN CH Oral Take 1 tablet by mouth daily.    Marland Kitchen ONDANSETRON 8 MG PO TBDP Oral Take 8 mg by mouth every 8 (eight) hours as needed. For nausea or vomiting       BP 114/77  Pulse 110  Temp(Src) 98.6 F (37 C) (Oral)  Resp 16  SpO2 100%  LMP 08/03/2011  Physical Exam  Nursing note and vitals reviewed. Constitutional: She is oriented to person, place, and time. She appears well-developed and well-nourished. No distress.  HENT:  Head: Normocephalic and atraumatic.  Eyes: EOM are normal.  Neck: Normal range of motion.  Cardiovascular: Normal rate, regular rhythm and normal heart sounds.   Pulmonary/Chest: Effort normal and breath sounds normal.  Abdominal: Soft. She exhibits no distension. There is no tenderness.  Musculoskeletal: Normal range of motion.  Neurological: She is alert and oriented to person, place, and time.  Skin: Skin is warm and dry.  Psychiatric: She has a normal mood and affect. Judgment normal.    ED Course  Procedures (including critical care time)   Date: 11/09/2011  Rate: 124  Rhythm: Sinus tachycardia  QRS Axis: normal  Intervals: normal  ST/T Wave abnormalities: normal  Conduction Disutrbances: none  Narrative Interpretation:   Old EKG Reviewed: No prior EKG available       Labs Reviewed  CBC - Abnormal; Notable for the following:    WBC  13.1 (*)    All other components within normal limits  URINALYSIS, WITH MICROSCOPIC - Abnormal; Notable for the following:    APPearance CLOUDY (*)    Ketones, ur 40 (*)    Squamous Epithelial / LPF FEW (*)    All other components within normal limits  BASIC METABOLIC PANEL  TROPONIN I  URINE RAPID DRUG SCREEN (HOSP PERFORMED)   Dg Chest 1 View  11/09/2011  *RADIOLOGY REPORT*  Clinical Data: Tachycardia.  CHEST - 1 VIEW  Comparison: None  Findings: The cardiac silhouette, mediastinal and hilar contours are within normal limits.  The lungs are clear except for minimal atelectasis in the right cardiophrenic angle region.  No pleural effusion or pneumothorax.  IMPRESSION: Minimal right basilar atelectasis.  No other significant pulmonary findings.  Original Report  Authenticated By: P. Loralie Champagne, M.D.   Ct Angio Chest W/cm &/or Wo Cm  11/09/2011  *RADIOLOGY REPORT*  Clinical Data: TACHYCARDIA CP  CT ANGIOGRAPHY CHEST  Technique:  Multidetector CT imaging of the chest using the standard protocol during bolus administration of intravenous contrast. Multiplanar reconstructed images including MIPs were obtained and reviewed to evaluate the vascular anatomy.  Contrast: 75mL OMNIPAQUE IOHEXOL 350 MG/ML IV SOLN  Comparison: 11/09/2011  Findings:  No enlarged supraclavicular or axillary lymph nodes.  There is no enlarged mediastinal or hilar adenopathy.  No pericardial or pleural effusions identified.  No abnormal filling defects are identified within the main pulmonary artery or its branches to suggest an acute pulmonary embolus.  There is no airspace consolidation.  Mild plate-like atelectasis noted in the left base.  Calcified granuloma is noted within the left lower lobe.  There is a 3 mm nodule in the right lower lobe, image 36.  Review of the visualized bony structures is unremarkable.  No worrisome bony abnormalities identified.  IMPRESSION:  1.  No acute pulmonary embolus. 2.  Pulmonary nodule in the right lower lobe measures 3 mm. If the patient is at high risk for bronchogenic carcinoma, follow-up chest CT at 1 year is recommended.  If the patient is at low risk, no follow-up is needed.  This recommendation follows the consensus statement: Guidelines for Management of Small Pulmonary Nodules Detected on CT Scans:  A Statement from the Fleischner Society as published in Radiology 2005; 237:395-400.  Original Report Authenticated By: Rosealee Albee, M.D.   I personally reviewed the CT scan.  The patient was informed of her 3 mm right lower lobe lung nodule and the need for a repeat CT scan in one year   1. SVT (supraventricular tachycardia)       MDM  We'll monitor the patient in the ER.  My suspicion for pulmonary embolism is very low.  Patient is  well-appearing at this time.  She is back in sinus rhythm.  She presented with sinus tachycardia however this is been resolving on its own.  Her outside strips are consistent with SVT and she did respond to adenosine by EMS.  She continued to be persistently tachycardic in the emergency department was complaining of some chest tightness this a CT angiogram was obtained to evaluate for possible pulmonary embolus.  CTA is negative for PE or other acute pathology.  The patient feels much better at this time.  Or discharge heart rate is 109.  She's been given outpatient cardiology followup numbers and she understands the importance of cardiology followup.  She was also given a copy of her EMS EKG strips to demonstrate SVT so  that she can have these for followup with her cardiologist.        Lyanne Co, MD 11/09/11 [phone]

## 2011-11-09 NOTE — ED Notes (Signed)
Patient provided [place] sandwich and tylenol for headache.  Family at bedside, patient resting quietly, vitals stable

## 2011-11-09 NOTE — ED Notes (Signed)
Patient refusing morphine, MD notified.

## 2011-11-09 NOTE — ED Notes (Signed)
PER EMS- patient from home, patient feeling palpitations, weakness, noted to be in SVT at a rate of 220's by EMS. Patient currently in sinus tachycardia at 119.  Patient alert and oriented x 4.

## 2011-11-09 NOTE — ED Notes (Signed)
Patient [redacted] weeks pregnant, FHR of 149, due September 23rd, 2013, Joy, rapid [place] RN at bedside.

## 2011-11-09 NOTE — Progress Notes (Signed)
Call received at 1045.  Arrived to [facility] ED room 17 at 1100.  Pt anxious and tearful.  Difficult to elicit information due to anxiety.  VSS.  Pt in sinus tachycardia.  G2P1   EDC 05/09/12 GA 14.0  FHT via doppler at 145.  No decelerations noted.  Abdomen soft and nontender upon palpation  Pt denies LOF, vaginal bleeding, UCs, abdominal tightening/cramping.  Pt receives [facility] in [place].   Dr Christell Constant updated re pt presence, status, OB/med hx and presenting complaint.  Pt released from [facility] standpoint by Dr Christell Constant at this time.

## 2011-11-11 ENCOUNTER — Encounter (HOSPITAL_COMMUNITY): Payer: Self-pay

## 2011-11-11 ENCOUNTER — Other Ambulatory Visit: Payer: Self-pay

## 2011-11-11 ENCOUNTER — Inpatient Hospital Stay (HOSPITAL_COMMUNITY)
Admission: AD | Admit: 2011-11-11 | Discharge: 2011-11-11 | Disposition: A | Discharge status: Home / Self Care | Payer: Medicaid Other | Source: Ambulatory Visit | Attending: Family Medicine | Admitting: Family Medicine

## 2011-11-11 DIAGNOSIS — O2390 Unspecified genitourinary tract infection in pregnancy, unspecified trimester: Secondary | ICD-10-CM | POA: Insufficient documentation

## 2011-11-11 DIAGNOSIS — R079 Chest pain, unspecified: Secondary | ICD-10-CM | POA: Insufficient documentation

## 2011-11-11 DIAGNOSIS — O26899 Other specified pregnancy related conditions, unspecified trimester: Secondary | ICD-10-CM

## 2011-11-11 DIAGNOSIS — B9689 Other specified bacterial agents as the cause of diseases classified elsewhere: Secondary | ICD-10-CM | POA: Insufficient documentation

## 2011-11-11 DIAGNOSIS — A499 Bacterial infection, unspecified: Secondary | ICD-10-CM | POA: Insufficient documentation

## 2011-11-11 DIAGNOSIS — R109 Unspecified abdominal pain: Secondary | ICD-10-CM | POA: Insufficient documentation

## 2011-11-11 DIAGNOSIS — N760 Acute vaginitis: Secondary | ICD-10-CM

## 2011-11-11 DIAGNOSIS — R42 Dizziness and giddiness: Secondary | ICD-10-CM | POA: Insufficient documentation

## 2011-11-11 LAB — CBC
MCH: 29.1 pg (ref 26.0–34.0)
MCHC: 33.8 g/dL (ref 30.0–36.0)
MCV: 86.1 fL (ref 78.0–100.0)
Platelets: 303 10*3/uL (ref 150–400)
RDW: 13.1 % (ref 11.5–15.5)
WBC: 10.4 10*3/uL (ref 4.0–10.5)

## 2011-11-11 LAB — COMPREHENSIVE METABOLIC PANEL
AST: 27 U/L (ref 0–37)
Albumin: 2.8 g/dL — ABNORMAL LOW (ref 3.5–5.2)
BUN: 10 mg/dL (ref 6–23)
Calcium: 9.5 mg/dL (ref 8.4–10.5)
Creatinine, Ser: 0.48 mg/dL — ABNORMAL LOW (ref 0.50–1.10)
Total Protein: 5.8 g/dL — ABNORMAL LOW (ref 6.0–8.3)

## 2011-11-11 LAB — URINALYSIS, ROUTINE W REFLEX MICROSCOPIC
Bilirubin Urine: NEGATIVE
Hgb urine dipstick: NEGATIVE
Ketones, ur: NEGATIVE mg/dL
Protein, ur: NEGATIVE mg/dL
Urobilinogen, UA: 0.2 mg/dL (ref 0.0–1.0)

## 2011-11-11 LAB — WET PREP, GENITAL: Yeast Wet Prep HPF POC: NONE SEEN

## 2011-11-11 MED ORDER — SODIUM CHLORIDE 0.9 % IV SOLN
Freq: Once | INTRAVENOUS | Status: AC
  Administered 2011-11-11: 15:00:00 via INTRAVENOUS

## 2011-11-11 MED ORDER — METRONIDAZOLE 0.75 % VA GEL: 1.0000 | VAGINAL | Status: AC

## 2011-11-11 NOTE — Discharge Instructions (Signed)
Drink at least 8 8-oz glasses of water every day. Take Tylenol 325 mg 2 tablets by mouth every 4 hours if needed for pain. Get your prescription filled and use as directed. May take ibuprofen by the package directions for 24 hours only for cramping. Advised to get a pregnancy support belt for abdomen and back Call the office if your symptoms worsen.

## 2011-11-11 NOTE — MAU Note (Signed)
Pt states cramping started at 0200 this am, bilateral lower abd and into her back. Does note pressure with voiding and having bm. Denies bleeding or vag d/c changes. Hx SVT, in ED 2 days ago. Given adenosine. Denies falling or passing out today.

## 2011-11-11 NOTE — MAU Provider Note (Signed)
History     CSN: [id number]  Arrival date and time: 11/11/11 1344   First Provider Initiated Contact with Patient 11/11/11 1442      Chief Complaint  Patient presents with  . Dizziness  . Chest Pain  . Abdominal Cramping  . Back Pain   HPI Connie Greer 41 y.o. 14w 2d by LMP.  Comes to MAU today with dizziness, constant abdominal and back pain since 2 am.  Was seen on Monday at [facility] ER and was in SVT.  Was given adenosine and converted to normal sinus rhythm.  States that today's symptoms are different than Monday.  OB History    Grav Para Term Preterm Abortions TAB SAB Ect Mult Living   2 1 0 1 0 0 0 0 0 1       Past Medical History  Diagnosis Date  . Heart murmur   . Pregnancy induced hypertension   . Preterm labor     Past Surgical History  Procedure Date  . Tonsillectomy     Family History  Problem Relation Age of Onset  . Hypertension Mother   . Cancer Mother   . Hypertension Father   . Anesthesia problems Neg Hx     History  Substance Use Topics  . Smoking status: Former Games developer  . Smokeless tobacco: Never Used  . Alcohol Use: No    Allergies: No Known Allergies  Prescriptions prior to admission  Medication Sig Dispense Refill  . acetaminophen (TYLENOL) 500 MG tablet Take 1,000 mg by mouth every 6 (six) hours as needed. For pain      . ondansetron (ZOFRAN-ODT) 8 MG disintegrating tablet Take 8 mg by mouth every 8 (eight) hours as needed. For nausea or vomiting      . Prenatal Vit-Fe Fumarate-FA (PRENATAL MULTIVITAMIN) TABS Take 1 tablet by mouth daily.        Review of Systems  Cardiovascular:       Chest tightening  Gastrointestinal: Positive for abdominal pain.  Genitourinary: Positive for dysuria.       Pressure with urination  Musculoskeletal: Positive for back pain.  Neurological: Positive for dizziness.   Physical Exam   Blood pressure 105/68, pulse 95, temperature 96.8 F (36 C), temperature source Oral, resp. rate 18, last menstrual  period 08/03/2011.  Physical Exam  Nursing note and vitals reviewed. Constitutional: She is oriented to person, place, and time. She appears well-developed and well-nourished.  HENT:  Head: Normocephalic.  Eyes: EOM are normal.  Neck: Neck supple.  GI: Soft. There is tenderness. There is no rebound and no guarding.       Mild tenderness with palpation, FHT 150  Genitourinary:       Speculum exam: Vulva - negative Vagina - Mod amount of creamy discharge, no odor Cervix - No contact bleeding Bimanual exam: Cervix closed, long Uterus  tender, gravid Adnexa non tender, no masses bilaterally GC/Chlam, wet prep done Chaperone present for exam.  Musculoskeletal: Normal range of motion.  Neurological: She is alert and oriented to person, place, and time.  Skin: Skin is warm and dry.  Psychiatric: She has a normal mood and affect.    MAU Course  Procedures Results for orders placed during the hospital encounter of 11/11/11 (from the past 24 hour(s))  URINALYSIS, ROUTINE W REFLEX MICROSCOPIC     Status: Normal   Collection Time   11/11/11  1:50 PM      Component Value Range   Color, Urine YELLOW  YELLOW  APPearance CLEAR  CLEAR    Specific Gravity, Urine 1.020  1.005 - 1.030    pH 7.0  5.0 - 8.0    Glucose, UA NEGATIVE  NEGATIVE (mg/dL)   Hgb urine dipstick NEGATIVE  NEGATIVE    Bilirubin Urine NEGATIVE  NEGATIVE    Ketones, ur NEGATIVE  NEGATIVE (mg/dL)   Protein, ur NEGATIVE  NEGATIVE (mg/dL)   Urobilinogen, UA 0.2  0.0 - 1.0 (mg/dL)   Nitrite NEGATIVE  NEGATIVE    Leukocytes, UA NEGATIVE  NEGATIVE   CBC     Status: Abnormal   Collection Time   11/11/11  2:11 PM      Component Value Range   WBC 10.4  4.0 - 10.5 (K/uL)   RBC 3.95  3.87 - 5.11 (MIL/uL)   Hemoglobin 11.5 (*) 12.0 - 15.0 (g/dL)   HCT 16.1 (*) 09.6 - 46.0 (%)   MCV 86.1  78.0 - 100.0 (fL)   MCH 29.1  26.0 - 34.0 (pg)   MCHC 33.8  30.0 - 36.0 (g/dL)   RDW 04.5  40.9 - 81.1 (%)   Platelets 303  150 - 400  (K/uL)  COMPREHENSIVE METABOLIC PANEL     Status: Abnormal   Collection Time   11/11/11  2:11 PM      Component Value Range   Sodium 135  135 - 145 (mEq/L)   Potassium 3.7  3.5 - 5.1 (mEq/L)   Chloride 103  96 - 112 (mEq/L)   CO2 22  19 - 32 (mEq/L)   Glucose, Bld 109 (*) 70 - 99 (mg/dL)   BUN 10  6 - 23 (mg/dL)   Creatinine, Ser 9.14 (*) 0.50 - 1.10 (mg/dL)   Calcium 9.5  8.4 - 78.2 (mg/dL)   Total Protein 5.8 (*) 6.0 - 8.3 (g/dL)   Albumin 2.8 (*) 3.5 - 5.2 (g/dL)   AST 27  0 - 37 (U/L)   ALT 48 (*) 0 - 35 (U/L)   Alkaline Phosphatase 47  39 - 117 (U/L)   Total Bilirubin 0.2 (*) 0.3 - 1.2 (mg/dL)   GFR calc non Af Amer >90  >90 (mL/min)   GFR calc Af Amer >90  >90 (mL/min)  WET PREP, GENITAL     Status: Abnormal   Collection Time   11/11/11  2:59 PM      Component Value Range   Yeast Wet Prep HPF POC NONE SEEN  NONE SEEN    Trich, Wet Prep NONE SEEN  NONE SEEN    Clue Cells Wet Prep HPF POC MODERATE (*) NONE SEEN    WBC, Wet Prep HPF POC FEW (*) NONE SEEN    MDM EKG done - normal sinus rhythm IV NS at 125cc/hr Consult with Dr. Shawnie Pons - reviewed plan of care  Assessment and Plan  14 weeks Lower abdominal cramping Bacterial vaginosis  Plan Drink at least 8 8-oz glasses of water every day. Take Tylenol 325 mg 2 tablets by mouth every 4 hours if needed for pain. rx metrogel vaginal cream - one applicatorful in vagina at bedtime for 5 days (one tube) no refills May take ibuprofen by the package directions for 24 hours only for cramping. Advised to get a pregnancy support belt for abdomen and back Call the office if your symptoms worsen. Be sure to eat every 2-3 hours small amounts. Note given to be out of work today and tomorrow.   Connie Greer 11/11/2011, 2:51 PM

## 2011-11-12 LAB — GC/CHLAMYDIA PROBE AMP, GENITAL
Chlamydia, DNA Probe: NEGATIVE
GC Probe Amp, Genital: NEGATIVE

## 2011-11-12 NOTE — MAU Provider Note (Signed)
Chart reviewed and agree with management and plan.  

## 2011-11-16 ENCOUNTER — Other Ambulatory Visit: Payer: Self-pay

## 2011-11-18 ENCOUNTER — Other Ambulatory Visit: Payer: Self-pay

## 2011-11-24 ENCOUNTER — Encounter: Payer: Self-pay | Admitting: Family Medicine

## 2011-11-24 ENCOUNTER — Other Ambulatory Visit (HOSPITAL_COMMUNITY)
Admission: RE | Admit: 2011-11-24 | Discharge: 2011-11-24 | Disposition: A | Discharge status: Home / Self Care | Payer: Medicaid Other | Source: Ambulatory Visit | Attending: Family Medicine | Admitting: Family Medicine

## 2011-11-24 ENCOUNTER — Ambulatory Visit (INDEPENDENT_AMBULATORY_CARE_PROVIDER_SITE_OTHER): Payer: Medicaid Other | Admitting: Family Medicine

## 2011-11-24 VITALS — BP 92/58 | Wt 137.0 lb

## 2011-11-24 DIAGNOSIS — O09299 Supervision of pregnancy with other poor reproductive or obstetric history, unspecified trimester: Secondary | ICD-10-CM | POA: Insufficient documentation

## 2011-11-24 DIAGNOSIS — O360990 Maternal care for other rhesus isoimmunization, unspecified trimester, not applicable or unspecified: Secondary | ICD-10-CM

## 2011-11-24 DIAGNOSIS — O26899 Other specified pregnancy related conditions, unspecified trimester: Secondary | ICD-10-CM | POA: Insufficient documentation

## 2011-11-24 DIAGNOSIS — R002 Palpitations: Secondary | ICD-10-CM

## 2011-11-24 DIAGNOSIS — Z6791 Unspecified blood type, Rh negative: Secondary | ICD-10-CM

## 2011-11-24 DIAGNOSIS — Z113 Encounter for screening for infections with a predominantly sexual mode of transmission: Secondary | ICD-10-CM | POA: Insufficient documentation

## 2011-11-24 DIAGNOSIS — Z3480 Encounter for supervision of other normal pregnancy, unspecified trimester: Secondary | ICD-10-CM

## 2011-11-24 DIAGNOSIS — Z01419 Encounter for gynecological examination (general) (routine) without abnormal findings: Secondary | ICD-10-CM | POA: Insufficient documentation

## 2011-11-24 DIAGNOSIS — R911 Solitary pulmonary nodule: Secondary | ICD-10-CM

## 2011-11-24 DIAGNOSIS — O0990 Supervision of high risk pregnancy, unspecified, unspecified trimester: Secondary | ICD-10-CM | POA: Insufficient documentation

## 2011-11-24 DIAGNOSIS — O09529 Supervision of elderly multigravida, unspecified trimester: Secondary | ICD-10-CM

## 2011-11-24 NOTE — Progress Notes (Signed)
Patient is here today to be established with [place] for her pregnancy, she has been seen at MAU and has had an ultrasound.  Had pap in October and Colpo in November was recomended 6 month follow up pap.

## 2011-11-24 NOTE — Patient Instructions (Signed)
Pregnancy - Second Trimester The second trimester of pregnancy (3 to 6 months) is a period of rapid growth for you and your baby. At the end of the sixth month, your baby is about 9 inches long and weighs 1 1/2 pounds. You will begin to feel the baby move between 18 and 20 weeks of the pregnancy. This is called quickening. Weight gain is faster. A clear fluid (colostrum) may leak out of your breasts. You may feel small contractions of the womb (uterus). This is known as false labor or Braxton-Hicks contractions. This is like a practice for labor when the baby is ready to be born. Usually, the problems with morning sickness have usually passed by the end of your first trimester. Some women develop small dark blotches (called cholasma, mask of pregnancy) on their face that usually goes away after the baby is born. Exposure to the sun makes the blotches worse. Acne may also develop in some pregnant women and pregnant women who have acne, may find that it goes away. PRENATAL EXAMS  Blood work may continue to be done during prenatal exams. These tests are done to check on your health and the probable health of your baby. Blood work is used to follow your blood levels (hemoglobin). Anemia (low hemoglobin) is common during pregnancy. Iron and vitamins are given to help prevent this. You will also be checked for diabetes between 24 and 28 weeks of the pregnancy. Some of the previous blood tests may be repeated.   The size of the uterus is measured during each visit. This is to make sure that the baby is continuing to grow properly according to the dates of the pregnancy.   Your blood pressure is checked every prenatal visit. This is to make sure you are not getting toxemia.   Your urine is checked to make sure you do not have an infection, diabetes or protein in the urine.   Your weight is checked often to make sure gains are happening at the suggested rate. This is to ensure that both you and your baby are  growing normally.   Sometimes, an ultrasound is performed to confirm the proper growth and development of the baby. This is a test which bounces harmless sound waves off the baby so your caregiver can more accurately determine due dates.  Sometimes, a specialized test is done on the amniotic fluid surrounding the baby. This test is called an amniocentesis. The amniotic fluid is obtained by sticking a needle into the belly (abdomen). This is done to check the chromosomes in instances where there is a concern about possible genetic problems with the baby. It is also sometimes done near the end of pregnancy if an early delivery is required. In this case, it is done to help make sure the baby's lungs are mature enough for the baby to live outside of the womb. CHANGES OCCURING IN THE SECOND TRIMESTER OF PREGNANCY Your body goes through many changes during pregnancy. They vary from person to person. Talk to your caregiver about changes you notice that you are concerned about.  During the second trimester, you will likely have an increase in your appetite. It is normal to have cravings for certain foods. This varies from person to person and pregnancy to pregnancy.   Your lower abdomen will begin to bulge.   You may have to urinate more often because the uterus and baby are pressing on your bladder. It is also common to get more bladder infections during pregnancy (  pain with urination). You can help this by drinking lots of fluids and emptying your bladder before and after intercourse.   You may begin to get stretch marks on your hips, abdomen, and breasts. These are normal changes in the body during pregnancy. There are no exercises or medications to take that prevent this change.   You may begin to develop swollen and bulging veins (varicose veins) in your legs. Wearing support hose, elevating your feet for 15 minutes, 3 to 4 times a day and limiting salt in your diet helps lessen the problem.    Heartburn may develop as the uterus grows and pushes up against the stomach. Antacids recommended by your caregiver helps with this problem. Also, eating smaller meals 4 to 5 times a day helps.   Constipation can be treated with a stool softener or adding bulk to your diet. Drinking lots of fluids, vegetables, fruits, and whole grains are helpful.   Exercising is also helpful. If you have been very active up until your pregnancy, most of these activities can be continued during your pregnancy. If you have been less active, it is helpful to start an exercise program such as walking.   Hemorrhoids (varicose veins in the rectum) may develop at the end of the second trimester. Warm sitz baths and hemorrhoid cream recommended by your caregiver helps hemorrhoid problems.   Backaches may develop during this time of your pregnancy. Avoid heavy lifting, wear low heal shoes and practice good posture to help with backache problems.   Some pregnant women develop tingling and numbness of their hand and fingers because of swelling and tightening of ligaments in the wrist (carpel tunnel syndrome). This goes away after the baby is born.   As your breasts enlarge, you may have to get a bigger bra. Get a comfortable, cotton, support bra. Do not get a nursing bra until the last month of the pregnancy if you will be nursing the baby.   You may get a dark line from your belly button to the pubic area called the linea nigra.   You may develop rosy cheeks because of increase blood flow to the face.   You may develop spider looking lines of the face, neck, arms and chest. These go away after the baby is born.  HOME CARE INSTRUCTIONS   It is extremely important to avoid all smoking, herbs, alcohol, and unprescribed drugs during your pregnancy. These chemicals affect the formation and growth of the baby. Avoid these chemicals throughout the pregnancy to ensure the delivery of a healthy infant.   Most of your home  care instructions are the same as suggested for the first trimester of your pregnancy. Keep your caregiver's appointments. Follow your caregiver's instructions regarding medication use, exercise and diet.   During pregnancy, you are providing food for you and your baby. Continue to eat regular, well-balanced meals. Choose foods such as meat, fish, milk and other low fat dairy products, vegetables, fruits, and whole-grain breads and cereals. Your caregiver will tell you of the ideal weight gain.   A physical sexual relationship may be continued up until near the end of pregnancy if there are no other problems. Problems could include early (premature) leaking of amniotic fluid from the membranes, vaginal bleeding, abdominal pain, or other medical or pregnancy problems.   Exercise regularly if there are no restrictions. Check with your caregiver if you are unsure of the safety of some of your exercises. The greatest weight gain will occur in the   last 2 trimesters of pregnancy. Exercise will help you:   Control your weight.   Get you in shape for labor and delivery.   Lose weight after you have the baby.   Wear a good support or jogging bra for breast tenderness during pregnancy. This may help if worn during sleep. Pads or tissues may be used in the bra if you are leaking colostrum.   Do not use hot tubs, steam rooms or saunas throughout the pregnancy.   Wear your seat belt at all times when driving. This protects you and your baby if you are in an accident.   Avoid raw meat, uncooked cheese, cat litter boxes and soil used by cats. These carry germs that can cause birth defects in the baby.   The second trimester is also a good time to visit your dentist for your dental health if this has not been done yet. Getting your teeth cleaned is OK. Use a soft toothbrush. Brush gently during pregnancy.   It is easier to loose urine during pregnancy. Tightening up and strengthening the pelvic muscles will  help with this problem. Practice stopping your urination while you are going to the bathroom. These are the same muscles you need to strengthen. It is also the muscles you would use as if you were trying to stop from passing gas. You can practice tightening these muscles up 10 times a set and repeating this about 3 times per day. Once you know what muscles to tighten up, do not perform these exercises during urination. It is more likely to contribute to an infection by backing up the urine.   Ask for help if you have financial, counseling or nutritional needs during pregnancy. Your caregiver will be able to offer counseling for these needs as well as refer you for other special needs.   Your skin may become oily. If so, wash your face with mild soap, use non-greasy moisturizer and oil or cream based makeup.  MEDICATIONS AND DRUG USE IN PREGNANCY  Take prenatal vitamins as directed. The vitamin should contain 1 milligram of folic acid. Keep all vitamins out of reach of children. Only a couple vitamins or tablets containing iron may be fatal to a baby or young child when ingested.   Avoid use of all medications, including herbs, over-the-counter medications, not prescribed or suggested by your caregiver. Only take over-the-counter or prescription medicines for pain, discomfort, or fever as directed by your caregiver. Do not use aspirin.   Let your caregiver also know about herbs you may be using.   Alcohol is related to a number of birth defects. This includes fetal alcohol syndrome. All alcohol, in any form, should be avoided completely. Smoking will cause low birth rate and premature babies.   Street or illegal drugs are very harmful to the baby. They are absolutely forbidden. A baby born to an addicted mother will be addicted at birth. The baby will go through the same withdrawal an adult does.  SEEK MEDICAL CARE IF:  You have any concerns or worries during your pregnancy. It is better to call with  your questions if you feel they cannot wait, rather than worry about them. SEEK IMMEDIATE MEDICAL CARE IF:   An unexplained oral temperature above 102 F (38.9 C) develops, or as your caregiver suggests.   You have leaking of fluid from the vagina (birth canal). If leaking membranes are suspected, take your temperature and tell your caregiver of this when you call.   There   is vaginal spotting, bleeding, or passing clots. Tell your caregiver of the amount and how many pads are used. Light spotting in pregnancy is common, especially following intercourse.   You develop a bad smelling vaginal discharge with a change in the color from clear to white.   You continue to feel sick to your stomach (nauseated) and have no relief from remedies suggested. You vomit blood or coffee ground-like materials.   You lose more than 2 pounds of weight or gain more than 2 pounds of weight over 1 week, or as suggested by your caregiver.   You notice swelling of your face, hands, feet, or legs.   You get exposed to German measles and have never had them.   You are exposed to fifth disease or chickenpox.   You develop belly (abdominal) pain. Round ligament discomfort is a common non-cancerous (benign) cause of abdominal pain in pregnancy. Your caregiver still must evaluate you.   You develop a bad headache that does not go away.   You develop fever, diarrhea, pain with urination, or shortness of breath.   You develop visual problems, blurry, or double vision.   You fall or are in a car accident or any kind of trauma.   There is mental or physical violence at home.  Document Released: 07/28/2001 Document Revised: 07/23/2011 Document Reviewed: 01/30/2009 [employer] Patient Information 2012 [employer]. 

## 2011-11-24 NOTE — Progress Notes (Signed)
   Subjective:    Rami [place] is a G2P1001 101w1d being seen today for her first obstetrical visit.  Her obstetrical history is significant for advanced maternal age. Patient does intend to breast feed. Pregnancy history fully reviewed.  Patient reports nausea and lower abdominal pain.  Filed Vitals:   11/24/11 1543  BP: 92/58  Weight: 137 lb (62.143 kg)    HISTORY: OB History    Grav Para Term Preterm Abortions TAB SAB Ect Mult Living   2 1 1  0 0 0 0 0 0 1     # Outc Date GA Lbr Len/2nd Wgt Sex Del Anes PTL Lv   1 TRM 8/94   7lb3oz(3.26kg) M SVD  No Yes   Comments: ? 32 wks, preeclampsia   2 CUR              Past Medical History  Diagnosis Date  . Heart murmur   . Pregnancy induced hypertension   . Heart palpitations    Past Surgical History  Procedure Date  . Tonsillectomy    Family History  Problem Relation Age of Onset  . Hypertension Mother   . Cancer Mother     Cervical  . Hypertension Father   . Anesthesia problems Neg Hx      Exam    Uterine Size: size equals dates  Pelvic Exam:    Perineum: No Hemorrhoids   Vulva: normal   Vagina:  normal mucosa       Cervix: no cervical motion tenderness and no lesions   Adnexa: normal adnexa   Bony Pelvis: average  System:     Skin: normal coloration and turgor, no rashes    Neurologic: oriented   Extremities: normal strength, tone, and muscle mass   HEENT sclera clear, anicteric   Mouth/Teeth mucous membranes moist, pharynx normal without lesions   Neck supple   Cardiovascular: regular rate and rhythm, no murmurs or gallops   Respiratory:  appears well, vitals normal, no respiratory distress, acyanotic, normal RR, ear and throat exam is normal, neck free of mass or lymphadenopathy, chest clear, no wheezing, crepitations, rhonchi, normal symmetric air entry   Abdomen: soft, non-tender; bowel sounds normal; no masses,  no organomegaly          Assessment:    Pregnancy: G2P1001 Patient Active Problem  List  Diagnoses  . AMA (advanced maternal age) multigravida 35+  . Supervision of other normal pregnancy  . H/O pre-eclampsia and eclampsia in prior pregnancy, currently pregnant  . Heart palpitations-SVT  . Rh negative status during pregnancy, antepartum  . Pulmonary nodule        Plan:     Initial labs drawn. Prenatal vitamins. Problem list reviewed and updated. Genetic Screening discussed Quad Screen: declined. AMA discussed-pt. And family decline genetics referral. Discussed eclampsia, pre-eclampsia.  Great BP today. Probable round ligament pain.  Ultrasound discussed; fetal survey: requested.-ordered  Follow up in 4 weeks. Marland Kitchen    Lavere Stork S 11/24/2011

## 2011-11-25 LAB — HIV ANTIBODY (ROUTINE TESTING W REFLEX): HIV: NONREACTIVE

## 2011-11-26 LAB — CULTURE, OB URINE: Organism ID, Bacteria: NO GROWTH

## 2011-12-15 ENCOUNTER — Ambulatory Visit (HOSPITAL_COMMUNITY)
Admission: RE | Admit: 2011-12-15 | Discharge: 2011-12-15 | Disposition: A | Discharge status: Home / Self Care | Payer: Medicaid Other | Source: Ambulatory Visit | Attending: Family Medicine | Admitting: Family Medicine

## 2011-12-15 ENCOUNTER — Encounter: Payer: Self-pay | Admitting: Family Medicine

## 2011-12-15 DIAGNOSIS — Z363 Encounter for antenatal screening for malformations: Secondary | ICD-10-CM | POA: Insufficient documentation

## 2011-12-15 DIAGNOSIS — O358XX0 Maternal care for other (suspected) fetal abnormality and damage, not applicable or unspecified: Secondary | ICD-10-CM | POA: Insufficient documentation

## 2011-12-15 DIAGNOSIS — Z1389 Encounter for screening for other disorder: Secondary | ICD-10-CM | POA: Insufficient documentation

## 2011-12-15 DIAGNOSIS — O09529 Supervision of elderly multigravida, unspecified trimester: Secondary | ICD-10-CM | POA: Insufficient documentation

## 2011-12-15 DIAGNOSIS — O09299 Supervision of pregnancy with other poor reproductive or obstetric history, unspecified trimester: Secondary | ICD-10-CM | POA: Insufficient documentation

## 2011-12-15 DIAGNOSIS — Z3480 Encounter for supervision of other normal pregnancy, unspecified trimester: Secondary | ICD-10-CM

## 2011-12-22 ENCOUNTER — Ambulatory Visit (INDEPENDENT_AMBULATORY_CARE_PROVIDER_SITE_OTHER): Payer: Medicaid Other | Admitting: Obstetrics & Gynecology

## 2011-12-22 VITALS — BP 105/57 | Wt 142.1 lb

## 2011-12-22 DIAGNOSIS — O360990 Maternal care for other rhesus isoimmunization, unspecified trimester, not applicable or unspecified: Secondary | ICD-10-CM

## 2011-12-22 DIAGNOSIS — O09529 Supervision of elderly multigravida, unspecified trimester: Secondary | ICD-10-CM

## 2011-12-22 DIAGNOSIS — Z3480 Encounter for supervision of other normal pregnancy, unspecified trimester: Secondary | ICD-10-CM

## 2011-12-22 DIAGNOSIS — R002 Palpitations: Secondary | ICD-10-CM

## 2011-12-22 DIAGNOSIS — Z6791 Unspecified blood type, Rh negative: Secondary | ICD-10-CM

## 2011-12-22 NOTE — Patient Instructions (Signed)
Postpartum Tubal Ligation A postpartum tubal ligation (PPTL) is when the fallopian tubes are tied after a pregnancy. The fallopian tubes carry the eggs from the ovary to the uterus. A PPTL procedure is done to permanently prevent pregnancy (sterilization). Although this procedure may be reversed, it should be considered permanent and irreversible. This means they cannot be repaired. You should consider that you will never have children again.  This decision should be thought over carefully and discussed with your partner. Discuss it while you are pregnant in order to make a more sound and intelligent decision, rather than waiting until the baby is born. It may be good to wait until a day or two after the birth to make sure everything is all right with the baby. You must be absolutely sure you do not want another pregnancy. PPTL should be delayed if any medical problems develop during labor and delivery with the mother or the baby. RISKS AND COMPLICATIONS  Infection. A germ starts growing in the wound. This can usually be treated with antibiotics.   Fever.   Bleeding is a complication of almost all surgeries. However, it is not common following this surgery.   Pregnancy. This may happen when the body repairs itself and the tubes or one of the tubes is again able to transport an egg to the uterus. This may also occur as the result of a surgical failure. All surgeries, regardless of how perfectly they are done, are not always successful with perfect results.   Tubal pregnancy. A tubal pregnancy may occur following a tubal ligation when the tube has repaired enough to transport an egg. Because the tube has been damaged by surgery, it is more likely that the fertilized egg can implant in the tube. A tubal pregnancy can be life-threatening.   Injury to surrounding organs and blood vessels.   There is an increase incidence of hysterectomy later in life. The reason is unknown.   Regret is a late  complication. You may wish to carry another pregnancy again. Chances of this can be lessened by careful decision making before the procedure. All possibilities should be thought of. This includes the things you do not like to think about such as divorce, loss of your spouse, death or loss of your children.  Women who regret having a tubal ligation and wish to become pregnant again have a couple of choices that include:  Reversing the tubal ligation, untying and connecting the tubes again. However:   There is a higher risk of a tubal pregnancy.   It is not always successful.   In vitro fertilization. However, it is:   Very complicated and demanding on the patient.   Not always successful.  PROCEDURE  The PPTL is a surgical procedure. This procedure can be safely performed right after delivery or the day after delivery.   If it is done following a vaginal delivery, it can be done through a small cut (incision) just beneath the belly button. A medicine will be used that numbs the area or puts you to sleep (anesthetic).   If a caesarean section is performed, the decision on whether to have a tubal ligation is usually made before the surgery. This is so the tubal ligation may be done at the same time, after delivery of the baby.   The fallopian tubes are tied off with 2 stitches (sutures).   A small piece of the tube is removed and then looked at under a microscope to make sure it is   the tube.  Different procedures are available for performing the surgery on the tubes. Your surgeon will discuss the pros and cons for PPTL. A PPTL does not require a longer hospital stay. Document Released: 08/03/2005 Document Revised: 07/23/2011 Document Reviewed: 11/21/2008 [employer] Patient Information 2012 [employer]. 

## 2011-12-22 NOTE — Progress Notes (Signed)
Normal anatomy scan.  Declines quad screen.  Has not met with cardiologist yet; this was encouraged given her history of SVT as she needs ECHO, possible Holter monitoring.  No other complaints or concerns.  Fetal movement and labor precautions reviewed.

## 2011-12-30 ENCOUNTER — Telehealth: Payer: Self-pay | Admitting: *Deleted

## 2011-12-30 NOTE — Telephone Encounter (Signed)
Patient needs appointment for follow up from er with cardiology.  We will have her come in Monday to see the Dr. And obtain referral.

## 2012-01-04 ENCOUNTER — Ambulatory Visit (INDEPENDENT_AMBULATORY_CARE_PROVIDER_SITE_OTHER): Payer: Medicaid Other | Admitting: Family Medicine

## 2012-01-04 VITALS — BP 95/67 | Wt 144.0 lb

## 2012-01-04 DIAGNOSIS — Z3480 Encounter for supervision of other normal pregnancy, unspecified trimester: Secondary | ICD-10-CM

## 2012-01-04 DIAGNOSIS — M2550 Pain in unspecified joint: Secondary | ICD-10-CM

## 2012-01-04 NOTE — Progress Notes (Signed)
Patient is here today for a cardiology referral.  She also has not been feeling well since last Thursday and it is gradually worse every day since.  She is having shortness of breath, headaches and pains in her joints, she says they are "locking" mostly her fingers but also her ankles and legs.

## 2012-01-04 NOTE — Progress Notes (Signed)
Reports joint pain, fatigue, SOB, headaches---unclear etiology--> will check thyroid, sed rate, ana Cards referral for SVT. SaO2 is 99%

## 2012-01-04 NOTE — Patient Instructions (Signed)
Pregnancy - Second Trimester The second trimester of pregnancy (3 to 6 months) is a period of rapid growth for you and your baby. At the end of the sixth month, your baby is about 9 inches long and weighs 1 1/2 pounds. You will begin to feel the baby move between 18 and 20 weeks of the pregnancy. This is called quickening. Weight gain is faster. A clear fluid (colostrum) may leak out of your breasts. You may feel small contractions of the womb (uterus). This is known as false labor or Braxton-Hicks contractions. This is like a practice for labor when the baby is ready to be born. Usually, the problems with morning sickness have usually passed by the end of your first trimester. Some women develop small dark blotches (called cholasma, mask of pregnancy) on their face that usually goes away after the baby is born. Exposure to the sun makes the blotches worse. Acne may also develop in some pregnant women and pregnant women who have acne, may find that it goes away. PRENATAL EXAMS  Blood work may continue to be done during prenatal exams. These tests are done to check on your health and the probable health of your baby. Blood work is used to follow your blood levels (hemoglobin). Anemia (low hemoglobin) is common during pregnancy. Iron and vitamins are given to help prevent this. You will also be checked for diabetes between 24 and 28 weeks of the pregnancy. Some of the previous blood tests may be repeated.   The size of the uterus is measured during each visit. This is to make sure that the baby is continuing to grow properly according to the dates of the pregnancy.   Your blood pressure is checked every prenatal visit. This is to make sure you are not getting toxemia.   Your urine is checked to make sure you do not have an infection, diabetes or protein in the urine.   Your weight is checked often to make sure gains are happening at the suggested rate. This is to ensure that both you and your baby are  growing normally.   Sometimes, an ultrasound is performed to confirm the proper growth and development of the baby. This is a test which bounces harmless sound waves off the baby so your caregiver can more accurately determine due dates.  Sometimes, a specialized test is done on the amniotic fluid surrounding the baby. This test is called an amniocentesis. The amniotic fluid is obtained by sticking a needle into the belly (abdomen). This is done to check the chromosomes in instances where there is a concern about possible genetic problems with the baby. It is also sometimes done near the end of pregnancy if an early delivery is required. In this case, it is done to help make sure the baby's lungs are mature enough for the baby to live outside of the womb. CHANGES OCCURING IN THE SECOND TRIMESTER OF PREGNANCY Your body goes through many changes during pregnancy. They vary from person to person. Talk to your caregiver about changes you notice that you are concerned about.  During the second trimester, you will likely have an increase in your appetite. It is normal to have cravings for certain foods. This varies from person to person and pregnancy to pregnancy.   Your lower abdomen will begin to bulge.   You may have to urinate more often because the uterus and baby are pressing on your bladder. It is also common to get more bladder infections during pregnancy (  pain with urination). You can help this by drinking lots of fluids and emptying your bladder before and after intercourse.   You may begin to get stretch marks on your hips, abdomen, and breasts. These are normal changes in the body during pregnancy. There are no exercises or medications to take that prevent this change.   You may begin to develop swollen and bulging veins (varicose veins) in your legs. Wearing support hose, elevating your feet for 15 minutes, 3 to 4 times a day and limiting salt in your diet helps lessen the problem.    Heartburn may develop as the uterus grows and pushes up against the stomach. Antacids recommended by your caregiver helps with this problem. Also, eating smaller meals 4 to 5 times a day helps.   Constipation can be treated with a stool softener or adding bulk to your diet. Drinking lots of fluids, vegetables, fruits, and whole grains are helpful.   Exercising is also helpful. If you have been very active up until your pregnancy, most of these activities can be continued during your pregnancy. If you have been less active, it is helpful to start an exercise program such as walking.   Hemorrhoids (varicose veins in the rectum) may develop at the end of the second trimester. Warm sitz baths and hemorrhoid cream recommended by your caregiver helps hemorrhoid problems.   Backaches may develop during this time of your pregnancy. Avoid heavy lifting, wear low heal shoes and practice good posture to help with backache problems.   Some pregnant women develop tingling and numbness of their hand and fingers because of swelling and tightening of ligaments in the wrist (carpel tunnel syndrome). This goes away after the baby is born.   As your breasts enlarge, you may have to get a bigger bra. Get a comfortable, cotton, support bra. Do not get a nursing bra until the last month of the pregnancy if you will be nursing the baby.   You may get a dark line from your belly button to the pubic area called the linea nigra.   You may develop rosy cheeks because of increase blood flow to the face.   You may develop spider looking lines of the face, neck, arms and chest. These go away after the baby is born.  HOME CARE INSTRUCTIONS   It is extremely important to avoid all smoking, herbs, alcohol, and unprescribed drugs during your pregnancy. These chemicals affect the formation and growth of the baby. Avoid these chemicals throughout the pregnancy to ensure the delivery of a healthy infant.   Most of your home  care instructions are the same as suggested for the first trimester of your pregnancy. Keep your caregiver's appointments. Follow your caregiver's instructions regarding medication use, exercise and diet.   During pregnancy, you are providing food for you and your baby. Continue to eat regular, well-balanced meals. Choose foods such as meat, fish, milk and other low fat dairy products, vegetables, fruits, and whole-grain breads and cereals. Your caregiver will tell you of the ideal weight gain.   A physical sexual relationship may be continued up until near the end of pregnancy if there are no other problems. Problems could include early (premature) leaking of amniotic fluid from the membranes, vaginal bleeding, abdominal pain, or other medical or pregnancy problems.   Exercise regularly if there are no restrictions. Check with your caregiver if you are unsure of the safety of some of your exercises. The greatest weight gain will occur in the   last 2 trimesters of pregnancy. Exercise will help you:   Control your weight.   Get you in shape for labor and delivery.   Lose weight after you have the baby.   Wear a good support or jogging bra for breast tenderness during pregnancy. This may help if worn during sleep. Pads or tissues may be used in the bra if you are leaking colostrum.   Do not use hot tubs, steam rooms or saunas throughout the pregnancy.   Wear your seat belt at all times when driving. This protects you and your baby if you are in an accident.   Avoid raw meat, uncooked cheese, cat litter boxes and soil used by cats. These carry germs that can cause birth defects in the baby.   The second trimester is also a good time to visit your dentist for your dental health if this has not been done yet. Getting your teeth cleaned is OK. Use a soft toothbrush. Brush gently during pregnancy.   It is easier to loose urine during pregnancy. Tightening up and strengthening the pelvic muscles will  help with this problem. Practice stopping your urination while you are going to the bathroom. These are the same muscles you need to strengthen. It is also the muscles you would use as if you were trying to stop from passing gas. You can practice tightening these muscles up 10 times a set and repeating this about 3 times per day. Once you know what muscles to tighten up, do not perform these exercises during urination. It is more likely to contribute to an infection by backing up the urine.   Ask for help if you have financial, counseling or nutritional needs during pregnancy. Your caregiver will be able to offer counseling for these needs as well as refer you for other special needs.   Your skin may become oily. If so, wash your face with mild soap, use non-greasy moisturizer and oil or cream based makeup.  MEDICATIONS AND DRUG USE IN PREGNANCY  Take prenatal vitamins as directed. The vitamin should contain 1 milligram of folic acid. Keep all vitamins out of reach of children. Only a couple vitamins or tablets containing iron may be fatal to a baby or young child when ingested.   Avoid use of all medications, including herbs, over-the-counter medications, not prescribed or suggested by your caregiver. Only take over-the-counter or prescription medicines for pain, discomfort, or fever as directed by your caregiver. Do not use aspirin.   Let your caregiver also know about herbs you may be using.   Alcohol is related to a number of birth defects. This includes fetal alcohol syndrome. All alcohol, in any form, should be avoided completely. Smoking will cause low birth rate and premature babies.   Street or illegal drugs are very harmful to the baby. They are absolutely forbidden. A baby born to an addicted mother will be addicted at birth. The baby will go through the same withdrawal an adult does.  SEEK MEDICAL CARE IF:  You have any concerns or worries during your pregnancy. It is better to call with  your questions if you feel they cannot wait, rather than worry about them. SEEK IMMEDIATE MEDICAL CARE IF:   An unexplained oral temperature above 102 F (38.9 C) develops, or as your caregiver suggests.   You have leaking of fluid from the vagina (birth canal). If leaking membranes are suspected, take your temperature and tell your caregiver of this when you call.   There   is vaginal spotting, bleeding, or passing clots. Tell your caregiver of the amount and how many pads are used. Light spotting in pregnancy is common, especially following intercourse.   You develop a bad smelling vaginal discharge with a change in the color from clear to white.   You continue to feel sick to your stomach (nauseated) and have no relief from remedies suggested. You vomit blood or coffee ground-like materials.   You lose more than 2 pounds of weight or gain more than 2 pounds of weight over 1 week, or as suggested by your caregiver.   You notice swelling of your face, hands, feet, or legs.   You get exposed to German measles and have never had them.   You are exposed to fifth disease or chickenpox.   You develop belly (abdominal) pain. Round ligament discomfort is a common non-cancerous (benign) cause of abdominal pain in pregnancy. Your caregiver still must evaluate you.   You develop a bad headache that does not go away.   You develop fever, diarrhea, pain with urination, or shortness of breath.   You develop visual problems, blurry, or double vision.   You fall or are in a car accident or any kind of trauma.   There is mental or physical violence at home.  Document Released: 07/28/2001 Document Revised: 07/23/2011 Document Reviewed: 01/30/2009 [employer] Patient Information 2012 [employer]. 

## 2012-01-05 LAB — SEDIMENTATION RATE: Sed Rate: 11 mm/hr (ref 0–22)

## 2012-01-07 ENCOUNTER — Encounter: Payer: Self-pay | Admitting: Cardiovascular Disease

## 2012-01-07 ENCOUNTER — Ambulatory Visit (INDEPENDENT_AMBULATORY_CARE_PROVIDER_SITE_OTHER): Payer: Medicaid Other | Admitting: Cardiovascular Disease

## 2012-01-07 ENCOUNTER — Ambulatory Visit: Payer: Medicaid Other | Admitting: Cardiovascular Disease

## 2012-01-07 VITALS — BP 102/62 | HR 103 | Ht 63.0 in | Wt 146.2 lb

## 2012-01-07 DIAGNOSIS — I471 Supraventricular tachycardia, unspecified: Secondary | ICD-10-CM

## 2012-01-07 DIAGNOSIS — R002 Palpitations: Secondary | ICD-10-CM

## 2012-01-07 NOTE — Patient Instructions (Signed)
Your physician has requested that you have an echocardiogram. Echocardiography is a painless test that uses sound waves to create images of your heart. It provides your doctor with information about the size and shape of your heart and how well your heart's chambers and valves are working. This procedure takes approximately one hour. There are no restrictions for this procedure.  Bring the EKG tracings with you to your next follow up visit.   Follow up after Echo.

## 2012-01-07 NOTE — Progress Notes (Signed)
HPI  This is a 41 year old female weighs [redacted] weeks pregnant. She was referred for evaluation of tachycardia. She was told in the past about tachyarrhythmia which was diagnosed 18 years ago after the delivery for first child. Since that time, she had a total of 4 episodes of tachycardia which did not require hospital admissions or ED visits up until the most recent episode in March of this year. In the past, she was able to terminate the tachycardia with vagal maneuvers. In March, she had a prolonged episode of palpitations and tachycardia that she could not terminate with Valsalva maneuvers and. Thus she called EMS. According to her description, her heart rate was 210 beats per minute. She was given IV adenosine outside and converted to normal sinus rhythm. At the time she arrived to the emergency room she was in sinus tachycardia. Her labs were unremarkable. Since that episode, she has not had any further similar tachycardia episode. She does have mild sinus tachycardia. This current pregnancy has not been going smoothly. She had significant morning sickness the first 3 months and currently she feels out of breath and dizzy. She is not aware of any other previous cardiac history but was told about a cardiac murmur in the past. She had routine labs done recently including TSH and these were normal.  No Known Allergies   Current Outpatient Prescriptions on File Prior to Visit  Medication Sig Dispense Refill  . acetaminophen (TYLENOL) 500 MG tablet Take 1,000 mg by mouth every 6 (six) hours as needed. For pain      . ondansetron (ZOFRAN-ODT) 8 MG disintegrating tablet Take 8 mg by mouth every 8 (eight) hours as needed. For nausea or vomiting      . Prenatal Vit-Fe Fumarate-FA (PRENATAL MULTIVITAMIN) TABS Take 1 tablet by mouth daily.         Past Medical History  Diagnosis Date  . Heart murmur   . Pregnancy induced hypertension   . Heart palpitations   . PSVT (paroxysmal supraventricular  tachycardia)      Past Surgical History  Procedure Date  . Tonsillectomy      Family History  Problem Relation Age of Onset  . Hypertension Mother   . Cancer Mother     Cervical  . Hypertension Father   . Anesthesia problems Neg Hx      History   Social History  . Marital Status: Married    Spouse Name: N/A    Number of Children: N/A  . Years of Education: N/A   Occupational History  . Not on file.   Social History Main Topics  . Smoking status: Former Smoker -- 0.2 packs/day for 10 years    Quit date: 08/31/2011  . Smokeless tobacco: Never Used  . Alcohol Use: No  . Drug Use: No  . Sexually Active: Yes    Birth Control/ Protection: None   Other Topics Concern  . Not on file   Social History Narrative  . No narrative on file     ROS Constitutional: Negative for fever, chills, diaphoresis, activity change, appetite change. HENT: Negative for hearing loss, nosebleeds, congestion, sore throat, facial swelling, drooling, trouble swallowing, neck pain, voice change, sinus pressure and tinnitus.  Eyes: Negative for photophobia, pain, discharge and visual disturbance.  Respiratory: Negative for apnea, cough and wheezing.  Cardiovascular: Negative for  leg swelling.  Gastrointestinal: Negative for nausea, vomiting, abdominal pain, diarrhea, constipation, blood in stool and abdominal distention.  Genitourinary: Negative for dysuria, urgency,  frequency, hematuria and decreased urine volume.  Musculoskeletal: Negative for myalgias, back pain, joint swelling, arthralgias and gait problem.  Skin: Negative for color change, pallor, rash and wound.  Neurological: Negative for dizziness, tremors, seizures, syncope, speech difficulty, weakness, light-headedness, numbness and headaches.  Psychiatric/Behavioral: Negative for suicidal ideas, hallucinations, behavioral problems and agitation. The patient is not nervous/anxious.     PHYSICAL EXAM   BP 102/62  Pulse 103   Ht 5\' 3"  (1.6 m)  Wt 146 lb 4 oz (66.339 kg)  BMI 25.91 kg/m2  LMP 08/03/2011  Constitutional: She is oriented to person, place, and time. She appears well-developed and well-nourished. No distress.  HENT: No nasal discharge.  Head: Normocephalic and atraumatic.  Eyes: Pupils are equal and round. Right eye exhibits no discharge. Left eye exhibits no discharge.  Neck: Normal range of motion. Neck supple. No JVD present. No thyromegaly present.  Cardiovascular: Normal rate, regular rhythm, normal heart sounds. Exam reveals no gallop and no friction rub. No murmur heard.  Pulmonary/Chest: Effort normal and breath sounds normal. No stridor. No respiratory distress. She has no wheezes. She has no rales. She exhibits no tenderness.  Abdominal: Soft. Bowel sounds are normal. She exhibits no distension. There is no tenderness. There is no rebound and no guarding.  Musculoskeletal: Normal range of motion. She exhibits no edema and no tenderness.  Neurological: She is alert and oriented to person, place, and time. Coordination normal.  Skin: Skin is warm and dry. No rash noted. She is not diaphoretic. No erythema. No pallor.  Psychiatric: She has a normal mood and affect. Her behavior is normal. Judgment and thought content normal.    EKG: Sinus tachycardia with a heart rate of 103 beats per minute. Normal PR and QT intervals. No significant ST or T wave changes.   ASSESSMENT AND PLAN

## 2012-01-07 NOTE — Assessment & Plan Note (Signed)
It appears that the patient had a recent episode of supraventricular tachycardia which was successfully terminated with adenosine. I discussed with her the pathophysiology and management of this condition. I think the first step is to make sure she does not have any structural heart abnormalities. Her physical exam and ECG are overall unremarkable. I recommend an echocardiogram to further evaluate this. She did have CTA of the chest during her emergency room visit there was no evidence of pulmonary embolism. 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 Regarding management, I will only recommend medications if she develops recurrent episodes. Antiarrhythmic medications are not recommended. Treatment with a beta blocker, calcium channel blocker or digoxin can be utilized if needed. However, all these treatments carry a slight risk on the fetus. Given that she has not had any recurrent episodes, it might be worth just observing her and continuing with vagal maneuvers to terminate the episodes. Even if she goes into SVT, treatment with adenosine might be safer than putting her on her medications regularly. I explained that if episodes of tachycardia become more frequent in the future after the delivery, an antiarrhythmic medication or an ablation procedure can be considered.

## 2012-01-19 ENCOUNTER — Encounter: Payer: Self-pay | Admitting: Family Medicine

## 2012-01-19 ENCOUNTER — Ambulatory Visit (INDEPENDENT_AMBULATORY_CARE_PROVIDER_SITE_OTHER): Payer: Medicaid Other | Admitting: Family Medicine

## 2012-01-19 DIAGNOSIS — Z3480 Encounter for supervision of other normal pregnancy, unspecified trimester: Secondary | ICD-10-CM

## 2012-01-19 NOTE — Progress Notes (Signed)
Has seen cards and is doing an ECHO soon. All labs from last week were normal.  She is feeling better.

## 2012-01-19 NOTE — Progress Notes (Signed)
Routine prenatal visit.  Follow up on labs.

## 2012-01-19 NOTE — Patient Instructions (Signed)
Pregnancy - Second Trimester The second trimester of pregnancy (3 to 6 months) is a period of rapid growth for you and your baby. At the end of the sixth month, your baby is about 9 inches long and weighs 1 1/2 pounds. You will begin to feel the baby move between 18 and 20 weeks of the pregnancy. This is called quickening. Weight gain is faster. A clear fluid (colostrum) may leak out of your breasts. You may feel small contractions of the womb (uterus). This is known as false labor or Braxton-Hicks contractions. This is like a practice for labor when the baby is ready to be born. Usually, the problems with morning sickness have usually passed by the end of your first trimester. Some women develop small dark blotches (called cholasma, mask of pregnancy) on their face that usually goes away after the baby is born. Exposure to the sun makes the blotches worse. Acne may also develop in some pregnant women and pregnant women who have acne, may find that it goes away. PRENATAL EXAMS  Blood work may continue to be done during prenatal exams. These tests are done to check on your health and the probable health of your baby. Blood work is used to follow your blood levels (hemoglobin). Anemia (low hemoglobin) is common during pregnancy. Iron and vitamins are given to help prevent this. You will also be checked for diabetes between 24 and 28 weeks of the pregnancy. Some of the previous blood tests may be repeated.   The size of the uterus is measured during each visit. This is to make sure that the baby is continuing to grow properly according to the dates of the pregnancy.   Your blood pressure is checked every prenatal visit. This is to make sure you are not getting toxemia.   Your urine is checked to make sure you do not have an infection, diabetes or protein in the urine.   Your weight is checked often to make sure gains are happening at the suggested rate. This is to ensure that both you and your baby are  growing normally.   Sometimes, an ultrasound is performed to confirm the proper growth and development of the baby. This is a test which bounces harmless sound waves off the baby so your caregiver can more accurately determine due dates.  Sometimes, a specialized test is done on the amniotic fluid surrounding the baby. This test is called an amniocentesis. The amniotic fluid is obtained by sticking a needle into the belly (abdomen). This is done to check the chromosomes in instances where there is a concern about possible genetic problems with the baby. It is also sometimes done near the end of pregnancy if an early delivery is required. In this case, it is done to help make sure the baby's lungs are mature enough for the baby to live outside of the womb. CHANGES OCCURING IN THE SECOND TRIMESTER OF PREGNANCY Your body goes through many changes during pregnancy. They vary from person to person. Talk to your caregiver about changes you notice that you are concerned about.  During the second trimester, you will likely have an increase in your appetite. It is normal to have cravings for certain foods. This varies from person to person and pregnancy to pregnancy.   Your lower abdomen will begin to bulge.   You may have to urinate more often because the uterus and baby are pressing on your bladder. It is also common to get more bladder infections during pregnancy (  pain with urination). You can help this by drinking lots of fluids and emptying your bladder before and after intercourse.   You may begin to get stretch marks on your hips, abdomen, and breasts. These are normal changes in the body during pregnancy. There are no exercises or medications to take that prevent this change.   You may begin to develop swollen and bulging veins (varicose veins) in your legs. Wearing support hose, elevating your feet for 15 minutes, 3 to 4 times a day and limiting salt in your diet helps lessen the problem.    Heartburn may develop as the uterus grows and pushes up against the stomach. Antacids recommended by your caregiver helps with this problem. Also, eating smaller meals 4 to 5 times a day helps.   Constipation can be treated with a stool softener or adding bulk to your diet. Drinking lots of fluids, vegetables, fruits, and whole grains are helpful.   Exercising is also helpful. If you have been very active up until your pregnancy, most of these activities can be continued during your pregnancy. If you have been less active, it is helpful to start an exercise program such as walking.   Hemorrhoids (varicose veins in the rectum) may develop at the end of the second trimester. Warm sitz baths and hemorrhoid cream recommended by your caregiver helps hemorrhoid problems.   Backaches may develop during this time of your pregnancy. Avoid heavy lifting, wear low heal shoes and practice good posture to help with backache problems.   Some pregnant women develop tingling and numbness of their hand and fingers because of swelling and tightening of ligaments in the wrist (carpel tunnel syndrome). This goes away after the baby is born.   As your breasts enlarge, you may have to get a bigger bra. Get a comfortable, cotton, support bra. Do not get a nursing bra until the last month of the pregnancy if you will be nursing the baby.   You may get a dark line from your belly button to the pubic area called the linea nigra.   You may develop rosy cheeks because of increase blood flow to the face.   You may develop spider looking lines of the face, neck, arms and chest. These go away after the baby is born.  HOME CARE INSTRUCTIONS   It is extremely important to avoid all smoking, herbs, alcohol, and unprescribed drugs during your pregnancy. These chemicals affect the formation and growth of the baby. Avoid these chemicals throughout the pregnancy to ensure the delivery of a healthy infant.   Most of your home  care instructions are the same as suggested for the first trimester of your pregnancy. Keep your caregiver's appointments. Follow your caregiver's instructions regarding medication use, exercise and diet.   During pregnancy, you are providing food for you and your baby. Continue to eat regular, well-balanced meals. Choose foods such as meat, fish, milk and other low fat dairy products, vegetables, fruits, and whole-grain breads and cereals. Your caregiver will tell you of the ideal weight gain.   A physical sexual relationship may be continued up until near the end of pregnancy if there are no other problems. Problems could include early (premature) leaking of amniotic fluid from the membranes, vaginal bleeding, abdominal pain, or other medical or pregnancy problems.   Exercise regularly if there are no restrictions. Check with your caregiver if you are unsure of the safety of some of your exercises. The greatest weight gain will occur in the   last 2 trimesters of pregnancy. Exercise will help you:   Control your weight.   Get you in shape for labor and delivery.   Lose weight after you have the baby.   Wear a good support or jogging bra for breast tenderness during pregnancy. This may help if worn during sleep. Pads or tissues may be used in the bra if you are leaking colostrum.   Do not use hot tubs, steam rooms or saunas throughout the pregnancy.   Wear your seat belt at all times when driving. This protects you and your baby if you are in an accident.   Avoid raw meat, uncooked cheese, cat litter boxes and soil used by cats. These carry germs that can cause birth defects in the baby.   The second trimester is also a good time to visit your dentist for your dental health if this has not been done yet. Getting your teeth cleaned is OK. Use a soft toothbrush. Brush gently during pregnancy.   It is easier to loose urine during pregnancy. Tightening up and strengthening the pelvic muscles will  help with this problem. Practice stopping your urination while you are going to the bathroom. These are the same muscles you need to strengthen. It is also the muscles you would use as if you were trying to stop from passing gas. You can practice tightening these muscles up 10 times a set and repeating this about 3 times per day. Once you know what muscles to tighten up, do not perform these exercises during urination. It is more likely to contribute to an infection by backing up the urine.   Ask for help if you have financial, counseling or nutritional needs during pregnancy. Your caregiver will be able to offer counseling for these needs as well as refer you for other special needs.   Your skin may become oily. If so, wash your face with mild soap, use non-greasy moisturizer and oil or cream based makeup.  MEDICATIONS AND DRUG USE IN PREGNANCY  Take prenatal vitamins as directed. The vitamin should contain 1 milligram of folic acid. Keep all vitamins out of reach of children. Only a couple vitamins or tablets containing iron may be fatal to a baby or young child when ingested.   Avoid use of all medications, including herbs, over-the-counter medications, not prescribed or suggested by your caregiver. Only take over-the-counter or prescription medicines for pain, discomfort, or fever as directed by your caregiver. Do not use aspirin.   Let your caregiver also know about herbs you may be using.   Alcohol is related to a number of birth defects. This includes fetal alcohol syndrome. All alcohol, in any form, should be avoided completely. Smoking will cause low birth rate and premature babies.   Street or illegal drugs are very harmful to the baby. They are absolutely forbidden. A baby born to an addicted mother will be addicted at birth. The baby will go through the same withdrawal an adult does.  SEEK MEDICAL CARE IF:  You have any concerns or worries during your pregnancy. It is better to call with  your questions if you feel they cannot wait, rather than worry about them. SEEK IMMEDIATE MEDICAL CARE IF:   An unexplained oral temperature above 102 F (38.9 C) develops, or as your caregiver suggests.   You have leaking of fluid from the vagina (birth canal). If leaking membranes are suspected, take your temperature and tell your caregiver of this when you call.   There   is vaginal spotting, bleeding, or passing clots. Tell your caregiver of the amount and how many pads are used. Light spotting in pregnancy is common, especially following intercourse.   You develop a bad smelling vaginal discharge with a change in the color from clear to white.   You continue to feel sick to your stomach (nauseated) and have no relief from remedies suggested. You vomit blood or coffee ground-like materials.   You lose more than 2 pounds of weight or gain more than 2 pounds of weight over 1 week, or as suggested by your caregiver.   You notice swelling of your face, hands, feet, or legs.   You get exposed to German measles and have never had them.   You are exposed to fifth disease or chickenpox.   You develop belly (abdominal) pain. Round ligament discomfort is a common non-cancerous (benign) cause of abdominal pain in pregnancy. Your caregiver still must evaluate you.   You develop a bad headache that does not go away.   You develop fever, diarrhea, pain with urination, or shortness of breath.   You develop visual problems, blurry, or double vision.   You fall or are in a car accident or any kind of trauma.   There is mental or physical violence at home.  Document Released: 07/28/2001 Document Revised: 07/23/2011 Document Reviewed: 01/30/2009 [employer] Patient Information 2012 [employer]. Contraception Choices Contraception (birth control) is the use of any methods or devices to prevent pregnancy. Below are some methods to help avoid pregnancy. HORMONAL METHODS   Contraceptive implant.  This is a thin, plastic tube containing progesterone hormone. It does not contain estrogen hormone. Your caregiver inserts the tube in the inner part of the upper arm. The tube can remain in place for up to 3 years. After 3 years, the implant must be removed. The implant prevents the ovaries from releasing an egg (ovulation), thickens the cervical mucus which prevents sperm from entering the uterus, and thins the lining of the inside of the uterus.   Progesterone-only injections. These injections are given every 3 months by your caregiver to prevent pregnancy. This synthetic progesterone hormone stops the ovaries from releasing eggs. It also thickens cervical mucus and changes the uterine lining. This makes it harder for sperm to survive in the uterus.   Birth control pills. These pills contain estrogen and progesterone hormone. They work by stopping the egg from forming in the ovary (ovulation). Birth control pills are prescribed by a caregiver.Birth control pills can also be used to treat heavy periods.   Minipill. This type of birth control pill contains only the progesterone hormone. They are taken every day of each month and must be prescribed by your caregiver.   Birth control patch. The patch contains hormones similar to those in birth control pills. It must be changed once a week and is prescribed by a caregiver.   Vaginal ring. The ring contains hormones similar to those in birth control pills. It is left in the vagina for 3 weeks, removed for 1 week, and then a new one is put back in place. The patient must be comfortable inserting and removing the ring from the vagina.A caregiver's prescription is necessary.   Emergency contraception. Emergency contraceptives prevent pregnancy after unprotected sexual intercourse. This pill can be taken right after sex or up to 5 days after unprotected sex. It is most effective the sooner you take the pills after having sexual intercourse. Emergency  contraceptive pills are available without a prescription.   Check with your pharmacist. Do not use emergency contraception as your only form of birth control.  BARRIER METHODS   Female condom. This is a thin sheath (latex or rubber) that is worn over the penis during sexual intercourse. It can be used with spermicide to increase effectiveness.   Female condom. This is a soft, loose-fitting sheath that is put into the vagina before sexual intercourse.   Diaphragm. This is a soft, latex, dome-shaped barrier that must be fitted by a caregiver. It is inserted into the vagina, along with a spermicidal jelly. It is inserted before intercourse. The diaphragm should be left in the vagina for 6 to 8 hours after intercourse.   Cervical cap. This is a round, soft, latex or plastic cup that fits over the cervix and must be fitted by a caregiver. The cap can be left in place for up to 48 hours after intercourse.   Sponge. This is a soft, circular piece of polyurethane foam. The sponge has spermicide in it. It is inserted into the vagina after wetting it and before sexual intercourse.   Spermicides. These are chemicals that kill or block sperm from entering the cervix and uterus. They come in the form of creams, jellies, suppositories, foam, or tablets. They do not require a prescription. They are inserted into the vagina with an applicator before having sexual intercourse. The process must be repeated every time you have sexual intercourse.  INTRAUTERINE CONTRACEPTION  Intrauterine device (IUD). This is a T-shaped device that is put in a woman's uterus during a menstrual period to prevent pregnancy. There are 2 types:   Copper IUD. This type of IUD is wrapped in copper wire and is placed inside the uterus. Copper makes the uterus and fallopian tubes produce a fluid that kills sperm. It can stay in place for 10 years.   Hormone IUD. This type of IUD contains the hormone progestin (synthetic progesterone). The  hormone thickens the cervical mucus and prevents sperm from entering the uterus, and it also thins the uterine lining to prevent implantation of a fertilized egg. The hormone can weaken or kill the sperm that get into the uterus. It can stay in place for 5 years.  PERMANENT METHODS OF CONTRACEPTION  Female tubal ligation. This is when the woman's fallopian tubes are surgically sealed, tied, or blocked to prevent the egg from traveling to the uterus.   Female sterilization. This is when the female has the tubes that carry sperm tied off (vasectomy).This blocks sperm from entering the vagina during sexual intercourse. After the procedure, the man can still ejaculate fluid (semen).  NATURAL PLANNING METHODS  Natural family planning. This is not having sexual intercourse or using a barrier method (condom, diaphragm, cervical cap) on days the woman could become pregnant.   Calendar method. This is keeping track of the length of each menstrual cycle and identifying when you are fertile.   Ovulation method. This is avoiding sexual intercourse during ovulation.   Symptothermal method. This is avoiding sexual intercourse during ovulation, using a thermometer and ovulation symptoms.   Post-ovulation method. This is timing sexual intercourse after you have ovulated.  Regardless of which type or method of contraception you choose, it is important that you use condoms to protect against the transmission of sexually transmitted diseases (STDs). Talk with your caregiver about which form of contraception is most appropriate for you. Document Released: 08/03/2005 Document Revised: 07/23/2011 Document Reviewed: 12/10/2010 [employer] Patient Information 2012 [employer]. Breastfeeding BENEFITS OF BREASTFEEDING For   the baby  The first milk (colostrum) helps the baby's digestive system function better.   There are antibodies from the mother in the milk that help the baby fight off infections.   The baby has a  lower incidence of asthma, allergies, and SIDS (sudden infant death syndrome).   The nutrients in breast milk are better than formulas for the baby and helps the baby's brain grow better.   Babies who breastfeed have less gas, colic, and constipation.  For the mother  Breastfeeding helps develop a very special bond between mother and baby.   It is more convenient, always available at the correct temperature and cheaper than formula feeding.   It burns calories in the mother and helps with losing weight that was gained during pregnancy.   It makes the uterus contract back down to normal size faster and slows bleeding following delivery.   Breastfeeding mothers have a lower risk of developing breast cancer.  NURSE FREQUENTLY  A healthy, full-term baby may breastfeed as often as every hour or space his or her feedings to every 3 hours.   How often to nurse will vary from baby to baby. Watch your baby for signs of hunger, not the clock.   Nurse as often as the baby requests, or when you feel the need to reduce the fullness of your breasts.   Awaken the baby if it has been 3 to 4 hours since the last feeding.   Frequent feeding will help the mother make more milk and will prevent problems like sore nipples and engorgement of the breasts.  BABY'S POSITION AT THE BREAST  Whether lying down or sitting, be sure that the baby's tummy is facing your tummy.   Support the breast with 4 fingers underneath the breast and the thumb above. Make sure your fingers are well away from the nipple and baby's mouth.   Stroke the baby's lips and cheek closest to the breast gently with your finger or nipple.   When the baby's mouth is open wide enough, place all of your nipple and as much of the dark area around the nipple as possible into your baby's mouth.   Pull the baby in close so the tip of the nose and the baby's cheeks touch the breast during the feeding.  FEEDINGS  The length of each feeding  varies from baby to baby and from feeding to feeding.   The baby must suck about 2 to 3 minutes for your milk to get to him or her. This is called a "let down." For this reason, allow the baby to feed on each breast as long as he or she wants. Your baby will end the feeding when he or she has received the right balance of nutrients.   To break the suction, put your finger into the corner of the baby's mouth and slide it between his or her gums before removing your breast from his or her mouth. This will help prevent sore nipples.  REDUCING BREAST ENGORGEMENT  In the first week after your baby is born, you may experience signs of breast engorgement. When breasts are engorged, they feel heavy, warm, full, and may be tender to the touch. You can reduce engorgement if you:   Nurse frequently, every 2 to 3 hours. Mothers who breastfeed early and often have fewer problems with engorgement.   Place light ice packs on your breasts between feedings. This reduces swelling. Wrap the ice packs in a lightweight towel to protect   your skin.   Apply moist hot packs to your breast for 5 to 10 minutes before each feeding. This increases circulation and helps the milk flow.   Gently massage your breast before and during the feeding.   Make sure that the baby empties at least one breast at every feeding before switching sides.   Use a breast pump to empty the breasts if your baby is sleepy or not nursing well. You may also want to pump if you are returning to work or or you feel you are getting engorged.   Avoid bottle feeds, pacifiers or supplemental feedings of water or juice in place of breastfeeding.   Be sure the baby is latched on and positioned properly while breastfeeding.   Prevent fatigue, stress, and anemia.   Wear a supportive bra, avoiding underwire styles.   Eat a balanced diet with enough fluids.  If you follow these suggestions, your engorgement should improve in 24 to 48 hours. If you are  still experiencing difficulty, call your lactation consultant or caregiver. IS MY BABY GETTING ENOUGH MILK? Sometimes, mothers worry about whether their babies are getting enough milk. You can be assured that your baby is getting enough milk if:  The baby is actively sucking and you hear swallowing.   The baby nurses at least 8 to 12 times in a 24 hour time period. Nurse your baby until he or she unlatches or falls asleep at the first breast (at least 10 to 20 minutes), then offer the second side.   The baby is wetting 5 to 6 disposable diapers (6 to 8 cloth diapers) in a 24 hour period by 5 to 6 days of age.   The baby is having at least 2 to 3 stools every 24 hours for the first few months. Breast milk is all the food your baby needs. It is not necessary for your baby to have water or formula. In fact, to help your breasts make more milk, it is best not to give your baby supplemental feedings during the early weeks.   The stool should be soft and yellow.   The baby should gain 4 to 7 ounces per week after he is 4 days old.  TAKE CARE OF YOURSELF Take care of your breasts by:  Bathing or showering daily.   Avoiding the use of soaps on your nipples.   Start feedings on your left breast at one feeding and on your right breast at the next feeding.   You will notice an increase in your milk supply 2 to 5 days after delivery. You may feel some discomfort from engorgement, which makes your breasts very firm and often tender. Engorgement "peaks" out within 24 to 48 hours. In the meantime, apply warm moist towels to your breasts for 5 to 10 minutes before feeding. Gentle massage and expression of some milk before feeding will soften your breasts, making it easier for your baby to latch on. Wear a well fitting nursing bra and air dry your nipples for 10 to 15 minutes after each feeding.   Only use cotton bra pads.   Only use pure lanolin on your nipples after nursing. You do not need to wash it  off before nursing.  Take care of yourself by:   Eating well-balanced meals and nutritious snacks.   Drinking milk, fruit juice, and water to satisfy your thirst (about 8 glasses a day).   Getting plenty of rest.   Increasing calcium in your diet (1200 mg   a day).   Avoiding foods that you notice affect the baby in a bad way.  SEEK MEDICAL CARE IF:   You have any questions or difficulty with breastfeeding.   You need help.   You have a hard, red, sore area on your breast, accompanied by a fever of 100.5 F (38.1 C) or more.   Your baby is too sleepy to eat well or is having trouble sleeping.   Your baby is wetting less than 6 diapers per day, by 5 days of age.   Your baby's skin or white part of his or her eyes is more yellow than it was in the hospital.   You feel depressed.  Document Released: 08/03/2005 Document Revised: 07/23/2011 Document Reviewed: 03/18/2009 Exit[employer] Patient Information 2012 [employer]. 

## 2012-01-25 ENCOUNTER — Other Ambulatory Visit (INDEPENDENT_AMBULATORY_CARE_PROVIDER_SITE_OTHER): Payer: Medicaid Other

## 2012-01-25 ENCOUNTER — Other Ambulatory Visit: Payer: Self-pay

## 2012-01-25 DIAGNOSIS — R002 Palpitations: Secondary | ICD-10-CM

## 2012-01-25 DIAGNOSIS — R000 Tachycardia, unspecified: Secondary | ICD-10-CM

## 2012-01-25 DIAGNOSIS — I471 Supraventricular tachycardia: Secondary | ICD-10-CM

## 2012-01-29 ENCOUNTER — Encounter: Payer: Self-pay | Admitting: Cardiovascular Disease

## 2012-01-29 ENCOUNTER — Ambulatory Visit (INDEPENDENT_AMBULATORY_CARE_PROVIDER_SITE_OTHER): Payer: Medicaid Other | Admitting: Cardiovascular Disease

## 2012-01-29 VITALS — BP 106/70 | HR 105 | Ht 62.0 in | Wt 148.0 lb

## 2012-01-29 DIAGNOSIS — I471 Supraventricular tachycardia: Secondary | ICD-10-CM

## 2012-01-29 NOTE — Patient Instructions (Addendum)
Follow up as needed

## 2012-01-30 NOTE — Assessment & Plan Note (Signed)
The patient had one episode of supraventricular tachycardia during pregnancy which was successfully terminated with adenosine. Her echocardiogram was within normal limits. She did have CTA of the chest during her emergency room visit there was no evidence of pulmonary embolism. 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 Regarding management, I will only recommend medications if she develops recurrent episodes.  If she develops another episode, I would consider treating her with a beta blocker or calcium channel blocker. For now we discussed again vagal maneuvers to terminate the arrhythmia if it happens.  I explained that if episodes of tachycardia become more frequent in the future after the delivery, an antiarrhythmic medication or an ablation procedure can be considered.

## 2012-01-30 NOTE — Progress Notes (Signed)
HPI  This is a 41 year old female who is [redacted] weeks pregnant. She is here today for a followup visit.Marland Kitchen She was told in the past about tachyarrhythmia which was diagnosed 18 years ago after the delivery for first child. Since that time, she had a total of 4 episodes of tachycardia which did not require hospital admissions or ED visits up until the most recent episode in March of this year. In the past, she was able to terminate the tachycardia with vagal maneuvers.  In March, she had a prolonged episode of palpitations and tachycardia that she could not terminate with Valsalva maneuvers and. Thus she called EMS. According to her description, her heart rate was 210 beats per minute. She was given IV adenosine outside and converted to normal sinus rhythm. At the time she arrived to the emergency room she was in sinus tachycardia. Her labs were unremarkable. Since that episode, she has not had any further similar tachycardia episode.  She does have mild sinus tachycardia.  She had no further episodes of tachycardia. She had an echocardiogram done which was normal.  No Known Allergies   Current Outpatient Prescriptions on File Prior to Visit  Medication Sig Dispense Refill  . acetaminophen (TYLENOL) 500 MG tablet Take 1,000 mg by mouth every 6 (six) hours as needed. For pain      . ondansetron (ZOFRAN-ODT) 8 MG disintegrating tablet Take 8 mg by mouth every 8 (eight) hours as needed. For nausea or vomiting      . Prenatal Vit-Fe Fumarate-FA (PRENATAL MULTIVITAMIN) TABS Take 1 tablet by mouth daily.         Past Medical History  Diagnosis Date  . Heart murmur   . Pregnancy induced hypertension   . Heart palpitations   . PSVT (paroxysmal supraventricular tachycardia)      Past Surgical History  Procedure Date  . Tonsillectomy      Family History  Problem Relation Age of Onset  . Hypertension Mother   . Cancer Mother     Cervical  . Hypertension Father   . Anesthesia problems Neg Hx       History   Social History  . Marital Status: Married    Spouse Name: N/A    Number of Children: N/A  . Years of Education: N/A   Occupational History  . Not on file.   Social History Main Topics  . Smoking status: Former Smoker -- 0.2 packs/day for 10 years    Quit date: 08/31/2011  . Smokeless tobacco: Never Used  . Alcohol Use: No  . Drug Use: No  . Sexually Active: Yes    Birth Control/ Protection: None   Other Topics Concern  . Not on file   Social History Narrative  . No narrative on file     PHYSICAL EXAM   BP 106/70  Pulse 105  Ht 5\' 2"  (1.575 m)  Wt 148 lb (67.132 kg)  BMI 27.07 kg/m2  LMP 08/03/2011  Constitutional: She is oriented to person, place, and time. She appears well-developed and well-nourished. No distress.  HENT: No nasal discharge.  Head: Normocephalic and atraumatic.  Eyes: Pupils are equal and round. Right eye exhibits no discharge. Left eye exhibits no discharge.  Neck: Normal range of motion. Neck supple. No JVD present. No thyromegaly present.  Cardiovascular: Normal rate, regular rhythm, normal heart sounds. Exam reveals no gallop and no friction rub. No murmur heard.  Pulmonary/Chest: Effort normal and breath sounds normal. No stridor. No respiratory  distress. She has no wheezes. She has no rales. She exhibits no tenderness.  Abdominal: Soft. Bowel sounds are normal. She exhibits no distension. There is no tenderness. There is no rebound and no guarding.  Musculoskeletal: Normal range of motion. She exhibits no edema and no tenderness.  Neurological: She is alert and oriented to person, place, and time. Coordination normal.  Skin: Skin is warm and dry. No rash noted. She is not diaphoretic. No erythema. No pallor.  Psychiatric: She has a normal mood and affect. Her behavior is normal. Judgment and thought content normal.    EKG: Sinus  Tachycardia  WITHIN NORMAL LIMITS   ASSESSMENT AND PLAN

## 2012-02-16 ENCOUNTER — Inpatient Hospital Stay (HOSPITAL_COMMUNITY)
Admission: AD | Admit: 2012-02-16 | Discharge: 2012-02-16 | Disposition: A | Discharge status: Home / Self Care | Payer: Medicaid Other | Source: Ambulatory Visit | Attending: Obstetrics & Gynecology | Admitting: Obstetrics & Gynecology

## 2012-02-16 ENCOUNTER — Ambulatory Visit (INDEPENDENT_AMBULATORY_CARE_PROVIDER_SITE_OTHER): Payer: Medicaid Other | Admitting: Family Medicine

## 2012-02-16 ENCOUNTER — Encounter (HOSPITAL_COMMUNITY): Payer: Self-pay | Admitting: *Deleted

## 2012-02-16 VITALS — BP 85/58 | Wt 148.1 lb

## 2012-02-16 DIAGNOSIS — Z6791 Unspecified blood type, Rh negative: Secondary | ICD-10-CM

## 2012-02-16 DIAGNOSIS — O4700 False labor before 37 completed weeks of gestation, unspecified trimester: Secondary | ICD-10-CM

## 2012-02-16 DIAGNOSIS — Z3480 Encounter for supervision of other normal pregnancy, unspecified trimester: Secondary | ICD-10-CM

## 2012-02-16 DIAGNOSIS — O09299 Supervision of pregnancy with other poor reproductive or obstetric history, unspecified trimester: Secondary | ICD-10-CM

## 2012-02-16 DIAGNOSIS — R109 Unspecified abdominal pain: Secondary | ICD-10-CM | POA: Insufficient documentation

## 2012-02-16 DIAGNOSIS — Z23 Encounter for immunization: Secondary | ICD-10-CM

## 2012-02-16 DIAGNOSIS — O09529 Supervision of elderly multigravida, unspecified trimester: Secondary | ICD-10-CM

## 2012-02-16 DIAGNOSIS — O360990 Maternal care for other rhesus isoimmunization, unspecified trimester, not applicable or unspecified: Secondary | ICD-10-CM

## 2012-02-16 LAB — URINALYSIS, ROUTINE W REFLEX MICROSCOPIC
Leukocytes, UA: NEGATIVE
Nitrite: NEGATIVE
Specific Gravity, Urine: <1.005 — ABNORMAL LOW (ref 1.005–1.030)
pH: 6 (ref 5.0–8.0)

## 2012-02-16 LAB — CBC
Platelets: 311 10*3/uL (ref 150–400)
RDW: 13.5 % (ref 11.5–15.5)
WBC: 8.5 10*3/uL (ref 4.0–10.5)

## 2012-02-16 MED ORDER — RHO D IMMUNE GLOBULIN 300 MCG IM INJ: 300.0000 ug | INJECTION | Freq: Once | INTRAMUSCULAR | Status: DC

## 2012-02-16 MED ORDER — RHO D IMMUNE GLOBULIN 300 MCG IM INJ
300.0000 ug | INJECTION | Freq: Once | INTRAMUSCULAR | Status: AC
  Administered 2012-02-16: 300 ug via INTRAMUSCULAR

## 2012-02-16 MED ORDER — NIFEDIPINE 10 MG PO CAPS
10.0000 mg | ORAL_CAPSULE | Freq: Once | ORAL | Status: AC
  Administered 2012-02-16: 10 mg via ORAL
  Filled 2012-02-16: qty 1

## 2012-02-16 MED ORDER — NIFEDIPINE ER OSMOTIC RELEASE 30 MG PO TB24: 30.0000 mg | ORAL_TABLET | Freq: Every day | ORAL | Status: DC

## 2012-02-16 NOTE — Progress Notes (Signed)
Seen this am at [facility] for contractions, Dr. Adrian Blackwater wanted her to keep appointment this afternoon to be rechecked.  Doing 1hr GTT today.

## 2012-02-16 NOTE — MAU Note (Signed)
Dr Carron Brazen in VE no change in exam.

## 2012-02-16 NOTE — MAU Provider Note (Signed)
History     CSN: [id number]  Arrival date and time: 02/16/12 4098   First Provider Initiated Contact with Patient 02/16/12 0547      Chief Complaint  Patient presents with  . Abdominal Pain   HPI This is a 41 y.o. female at [redacted]w[redacted]d who presents with c/o 1.5 weeks duration of pressure and contractions. Thought it would go away but got worse tonight. Denies leaking or bleeding.  OB History    Grav Para Term Preterm Abortions TAB SAB Ect Mult Living   2 1 1  0 0 0 0 0 0 1      Past Medical History  Diagnosis Date  . Heart murmur   . Pregnancy induced hypertension   . Heart palpitations   . PSVT (paroxysmal supraventricular tachycardia)     Past Surgical History  Procedure Date  . Tonsillectomy     Family History  Problem Relation Age of Onset  . Hypertension Mother   . Cancer Mother     Cervical  . Hypertension Father   . Anesthesia problems Neg Hx     History  Substance Use Topics  . Smoking status: Former Smoker -- 0.2 packs/day for 10 years    Quit date: 08/31/2011  . Smokeless tobacco: Never Used  . Alcohol Use: No    Allergies: No Known Allergies  Prescriptions prior to admission  Medication Sig Dispense Refill  . acetaminophen (TYLENOL) 500 MG tablet Take 1,000 mg by mouth every 6 (six) hours as needed. For pain      . Prenatal Vit-Fe Fumarate-FA (PRENATAL MULTIVITAMIN) TABS Take 1 tablet by mouth daily.      . ondansetron (ZOFRAN-ODT) 8 MG disintegrating tablet Take 8 mg by mouth every 8 (eight) hours as needed. For nausea or vomiting        ROS As listed in HPI Physical Exam   Blood pressure 111/71, pulse 86, temperature 98.3 F (36.8 C), temperature source Oral, resp. rate 20, height 5\' 2"  (1.575 m), weight 150 lb (68.04 kg), last menstrual period 08/03/2011, SpO2 100.00%.  Physical Exam  Constitutional: She is oriented to person, place, and time. She appears well-developed and well-nourished. No distress (but breathing through contractions).    Cardiovascular: Normal rate.   Respiratory: Effort normal.  GI: Soft. She exhibits no distension and no mass. There is no tenderness. There is no rebound and no guarding.  Genitourinary: Vagina normal and uterus normal. No vaginal discharge found.  Musculoskeletal: Normal range of motion.  Neurological: She is alert and oriented to person, place, and time.  Skin: Skin is warm and dry.  Psychiatric: She has a normal mood and affect.   FHR reactive. UCs every 6 min, some 20-30 seconds, occasionally they last 1 minute. Dilation: Closed Effacement (%): Thick Exam by:: Williams,CNM  MAU Course  Procedures  MDM FFn not sent. Will try some Procardia >> no response from first dose, will give a second dose.  Assessment and Plan  Report to D. Poe CNM  [facility] 02/16/2012, 5:54 AM    Patient seen - having cramping and uterine irritability.  No contractions seen on toco.  NST - Category 1 tracing.  Cervical exam - unchanged after an hour: Dilation: Fingertip Effacement (%): Thick Exam by:: Eamon Tantillo  Discussed patient with Dr Jolayne Panther: okay to discharge with cramping on procardia.  1.  Preterm Contractions Procardia xl 30mg  daily.  Patient to be seen in clinic later today for prenatal.  Patient to return if contractions worsen.  Rhona Raider  Thurma Priego, DO

## 2012-02-16 NOTE — Patient Instructions (Addendum)
Preterm Labor Preterm labor is when labor starts at less than 37 weeks of pregnancy. The normal length of a pregnancy is 39 to 41 weeks. CAUSES Often, there is no identifiable underlying cause as to why a woman goes into preterm labor. However, one of the most common known causes of preterm labor is infection. Infections of the uterus, cervix, vagina, amniotic sac, bladder, kidney, or even the lungs (pneumonia) can cause labor to start. Other causes of preterm labor include:  Urogenital infections, such as yeast infections and bacterial vaginosis.   Uterine abnormalities (uterine shape, uterine septum, fibroids, bleeding from the placenta).   A cervix that has been operated on and opens prematurely.   Malformations in the baby.   Multiple gestations (twins, triplets, and so on).   Breakage of the amniotic sac.  Additional risk factors for preterm labor include:  Previous history of preterm labor.   Premature rupture of membranes (PROM).   A placenta that covers the opening of the cervix (placenta previa).   A placenta that separates from the uterus (placenta abruption).   A cervix that is too weak to hold the baby in the uterus (incompetence cervix).   Having too much fluid in the amniotic sac (polyhydramnios).   Taking illegal drugs or smoking while pregnant.   Not gaining enough weight while pregnant.   Women younger than 18 and older than 40 years old.   Low socioeconomic status.   African-American ethnicity.  SYMPTOMS Signs and symptoms of preterm labor include:  Menstrual-like cramps.   Contractions that are 30 to 70 seconds apart, become very regular, closer together, and are more intense and painful.   Contractions that start on the top of the uterus and spread down to the lower abdomen and back.   A sense of increased pelvic pressure or back pain.   A watery or bloody discharge that comes from the vagina.  DIAGNOSIS  A diagnosis can be confirmed by:  A  vaginal exam.   An ultrasound of the cervix.   Sampling (swabbing) cervico-vaginal secretions. These samples can be tested for the presence of fetal fibronectin. This is a protein found in cervical discharge which is associated with preterm labor.   Fetal monitoring.  TREATMENT  Depending on the length of the pregnancy and other circumstances, a caregiver may suggest bed rest. If necessary, there are medicines that can be given to stop contractions and to quicken fetal lung maturity. If labor happens before 34 weeks of pregnancy, a prolonged hospital stay may be recommended. Treatment depends on the condition of both the mother and baby. PREVENTION There are some things a mother can do to lower the risk of preterm labor in future pregnancies. A woman can:   Stop smoking.   Maintain healthy weight gain and avoid chemicals and drugs that are not necessary.   Be watchful for any type of infection.   Inform her caregiver if she has a known history of preterm labor.  Document Released: 10/24/2003 Document Revised: 07/23/2011 Document Reviewed: 11/28/2010 Exit[employer] Patient Information 2012 [employer]. Breastfeeding BENEFITS OF BREASTFEEDING For the baby  The first milk (colostrum) helps the baby's digestive system function better.   There are antibodies from the mother in the milk that help the baby fight off infections.   The baby has a lower incidence of asthma, allergies, and SIDS (sudden infant death syndrome).   The nutrients in breast milk are better than formulas for the baby and helps the baby's brain grow better.     Babies who breastfeed have less gas, colic, and constipation.  For the mother  Breastfeeding helps develop a very special bond between mother and baby.   It is more convenient, always available at the correct temperature and cheaper than formula feeding.   It burns calories in the mother and helps with losing weight that was gained during pregnancy.   It  makes the uterus contract back down to normal size faster and slows bleeding following delivery.   Breastfeeding mothers have a lower risk of developing breast cancer.  NURSE FREQUENTLY  A healthy, full-term baby may breastfeed as often as every hour or space his or her feedings to every 3 hours.   How often to nurse will vary from baby to baby. Watch your baby for signs of hunger, not the clock.   Nurse as often as the baby requests, or when you feel the need to reduce the fullness of your breasts.   Awaken the baby if it has been 3 to 4 hours since the last feeding.   Frequent feeding will help the mother make more milk and will prevent problems like sore nipples and engorgement of the breasts.  BABY'S POSITION AT THE BREAST  Whether lying down or sitting, be sure that the baby's tummy is facing your tummy.   Support the breast with 4 fingers underneath the breast and the thumb above. Make sure your fingers are well away from the nipple and baby's mouth.   Stroke the baby's lips and cheek closest to the breast gently with your finger or nipple.   When the baby's mouth is open wide enough, place all of your nipple and as much of the dark area around the nipple as possible into your baby's mouth.   Pull the baby in close so the tip of the nose and the baby's cheeks touch the breast during the feeding.  FEEDINGS  The length of each feeding varies from baby to baby and from feeding to feeding.   The baby must suck about 2 to 3 minutes for your milk to get to him or her. This is called a "let down." For this reason, allow the baby to feed on each breast as long as he or she wants. Your baby will end the feeding when he or she has received the right balance of nutrients.   To break the suction, put your finger into the corner of the baby's mouth and slide it between his or her gums before removing your breast from his or her mouth. This will help prevent sore nipples.  REDUCING BREAST  ENGORGEMENT  In the first week after your baby is born, you may experience signs of breast engorgement. When breasts are engorged, they feel heavy, warm, full, and may be tender to the touch. You can reduce engorgement if you:   Nurse frequently, every 2 to 3 hours. Mothers who breastfeed early and often have fewer problems with engorgement.   Place light ice packs on your breasts between feedings. This reduces swelling. Wrap the ice packs in a lightweight towel to protect your skin.   Apply moist hot packs to your breast for 5 to 10 minutes before each feeding. This increases circulation and helps the milk flow.   Gently massage your breast before and during the feeding.   Make sure that the baby empties at least one breast at every feeding before switching sides.   Use a breast pump to empty the breasts if your baby is sleepy or   not nursing well. You may also want to pump if you are returning to work or or you feel you are getting engorged.   Avoid bottle feeds, pacifiers or supplemental feedings of water or juice in place of breastfeeding.   Be sure the baby is latched on and positioned properly while breastfeeding.   Prevent fatigue, stress, and anemia.   Wear a supportive bra, avoiding underwire styles.   Eat a balanced diet with enough fluids.  If you follow these suggestions, your engorgement should improve in 24 to 48 hours. If you are still experiencing difficulty, call your lactation consultant or caregiver. IS MY BABY GETTING ENOUGH MILK? Sometimes, mothers worry about whether their babies are getting enough milk. You can be assured that your baby is getting enough milk if:  The baby is actively sucking and you hear swallowing.   The baby nurses at least 8 to 12 times in a 24 hour time period. Nurse your baby until he or she unlatches or falls asleep at the first breast (at least 10 to 20 minutes), then offer the second side.   The baby is wetting 5 to 6 disposable diapers  (6 to 8 cloth diapers) in a 24 hour period by 5 to 6 days of age.   The baby is having at least 2 to 3 stools every 24 hours for the first few months. Breast milk is all the food your baby needs. It is not necessary for your baby to have water or formula. In fact, to help your breasts make more milk, it is best not to give your baby supplemental feedings during the early weeks.   The stool should be soft and yellow.   The baby should gain 4 to 7 ounces per week after he is 4 days old.  TAKE CARE OF YOURSELF Take care of your breasts by:  Bathing or showering daily.   Avoiding the use of soaps on your nipples.   Start feedings on your left breast at one feeding and on your right breast at the next feeding.   You will notice an increase in your milk supply 2 to 5 days after delivery. You may feel some discomfort from engorgement, which makes your breasts very firm and often tender. Engorgement "peaks" out within 24 to 48 hours. In the meantime, apply warm moist towels to your breasts for 5 to 10 minutes before feeding. Gentle massage and expression of some milk before feeding will soften your breasts, making it easier for your baby to latch on. Wear a well fitting nursing bra and air dry your nipples for 10 to 15 minutes after each feeding.   Only use cotton bra pads.   Only use pure lanolin on your nipples after nursing. You do not need to wash it off before nursing.  Take care of yourself by:   Eating well-balanced meals and nutritious snacks.   Drinking milk, fruit juice, and water to satisfy your thirst (about 8 glasses a day).   Getting plenty of rest.   Increasing calcium in your diet (1200 mg a day).   Avoiding foods that you notice affect the baby in a bad way.  SEEK MEDICAL CARE IF:   You have any questions or difficulty with breastfeeding.   You need help.   You have a hard, red, sore area on your breast, accompanied by a fever of 100.5 F (38.1 C) or more.   Your  baby is too sleepy to eat well or is having   trouble sleeping.   Your baby is wetting less than 6 diapers per day, by 5 days of age.   Your baby's skin or white part of his or her eyes is more yellow than it was in the hospital.   You feel depressed.  Document Released: 08/03/2005 Document Revised: 07/23/2011 Document Reviewed: 03/18/2009 [employer] Patient Information 2012 [employer]. 

## 2012-02-16 NOTE — Progress Notes (Signed)
Pt. Reports some continued contractions.  No documented cervical change in MAU x 8 hours.--No more change over 3 hours here. 28 wk labs + Rhogam

## 2012-02-16 NOTE — Discharge Instructions (Signed)
Preventing Preterm Labor Preterm labor is when a pregnant woman has contractions that cause the cervix to open, shorten, and thin before 37 weeks of pregnancy. You will have regular contractions (tightening) 2 to 3 minutes apart. This usually causes discomfort or pain. HOME CARE  Eat a healthy diet.   Take your vitamins as told by your doctor.   Drink enough fluids to keep your pee (urine) clear or pale yellow every day.   Get rest and sleep.   Do not have sex if you are at high risk for preterm labor.   Follow your doctor's advice about activity, medicines, and tests.   Avoid stress.   Avoid hard labor or exercise that lasts for a long time.   Do not smoke.  GET HELP RIGHT AWAY IF:   You are having contractions.   You have belly (abdominal) pain.   You have bleeding from your vagina.   You have pain when you pee (urinate).   You have abnormal discharge from your vagina.   You have a temperature by mouth above 102 F (38.9 C).  MAKE SURE YOU:  Understand these instructions.   Will watch your condition.   Will get help if you are not doing well or get worse.  Document Released: 10/30/2008 Document Revised: 07/23/2011 Document Reviewed: 10/30/2008 [employer] Patient Information 2012 [employer]. 

## 2012-02-16 NOTE — MAU Note (Signed)
Pt reports lower abd and lower back pain off/on x 1.5 weeks. Worsening tonight. Reports pain/pressure with urination.

## 2012-02-16 NOTE — Addendum Note (Signed)
Addended by: Reva Bores on: 02/16/2012 05:04 PM   Modules accepted: Orders

## 2012-02-17 ENCOUNTER — Encounter: Payer: Self-pay | Admitting: Family Medicine

## 2012-02-17 LAB — RPR

## 2012-02-17 LAB — GLUCOSE TOLERANCE, 1 HOUR (50G) W/O FASTING: Glucose, 1 Hour GTT: 151 mg/dL — ABNORMAL HIGH (ref 70–140)

## 2012-02-17 NOTE — Progress Notes (Signed)
Patient call result given. Appointment was given to the pt. By Darl Pikes for her 3 hours gtt.

## 2012-03-01 ENCOUNTER — Ambulatory Visit (INDEPENDENT_AMBULATORY_CARE_PROVIDER_SITE_OTHER): Payer: Medicaid Other | Admitting: Obstetrics & Gynecology

## 2012-03-01 ENCOUNTER — Ambulatory Visit: Payer: Medicaid Other | Admitting: Obstetrics & Gynecology

## 2012-03-01 VITALS — BP 95/67 | Wt 156.0 lb

## 2012-03-01 DIAGNOSIS — O4700 False labor before 37 completed weeks of gestation, unspecified trimester: Secondary | ICD-10-CM

## 2012-03-01 DIAGNOSIS — O09299 Supervision of pregnancy with other poor reproductive or obstetric history, unspecified trimester: Secondary | ICD-10-CM

## 2012-03-01 DIAGNOSIS — O09529 Supervision of elderly multigravida, unspecified trimester: Secondary | ICD-10-CM

## 2012-03-01 DIAGNOSIS — O0990 Supervision of high risk pregnancy, unspecified, unspecified trimester: Secondary | ICD-10-CM

## 2012-03-01 DIAGNOSIS — Z6791 Unspecified blood type, Rh negative: Secondary | ICD-10-CM

## 2012-03-01 DIAGNOSIS — O26899 Other specified pregnancy related conditions, unspecified trimester: Secondary | ICD-10-CM

## 2012-03-01 DIAGNOSIS — O360990 Maternal care for other rhesus isoimmunization, unspecified trimester, not applicable or unspecified: Secondary | ICD-10-CM

## 2012-03-01 DIAGNOSIS — O99810 Abnormal glucose complicating pregnancy: Secondary | ICD-10-CM

## 2012-03-01 NOTE — Patient Instructions (Signed)
Return to clinic for any obstetric concerns or go to [facility] for evaluation  Postpartum Tubal Ligation A postpartum tubal ligation (PPTL) is when the fallopian tubes are tied after a pregnancy. The fallopian tubes carry the eggs from the ovary to the uterus. A PPTL procedure is done to permanently prevent pregnancy (sterilization). Although this procedure may be reversed, it should be considered permanent and irreversible. This means they cannot be repaired. You should consider that you will never have children again.  This decision should be thought over carefully and discussed with your partner. Discuss it while you are pregnant in order to make a more sound and intelligent decision, rather than waiting until the baby is born. It may be good to wait until a day or two after the birth to make sure everything is all right with the baby. You must be absolutely sure you do not want another pregnancy. PPTL should be delayed if any medical problems develop during labor and delivery with the mother or the baby. RISKS AND COMPLICATIONS  Infection. A germ starts growing in the wound. This can usually be treated with antibiotics.   Fever.   Bleeding is a complication of almost all surgeries. However, it is not common following this surgery.   Pregnancy. This may happen when the body repairs itself and the tubes or one of the tubes is again able to transport an egg to the uterus. This may also occur as the result of a surgical failure. All surgeries, regardless of how perfectly they are done, are not always successful with perfect results.   Tubal pregnancy. A tubal pregnancy may occur following a tubal ligation when the tube has repaired enough to transport an egg. Because the tube has been damaged by surgery, it is more likely that the fertilized egg can implant in the tube. A tubal pregnancy can be life-threatening.   Injury to surrounding organs and blood vessels.   There is an increase incidence of  hysterectomy later in life. The reason is unknown.   Regret is a late complication. You may wish to carry another pregnancy again. Chances of this can be lessened by careful decision making before the procedure. All possibilities should be thought of. This includes the things you do not like to think about such as divorce, loss of your spouse, death or loss of your children.  Women who regret having a tubal ligation and wish to become pregnant again have a couple of choices that include:  Reversing the tubal ligation, untying and connecting the tubes again. However:   There is a higher risk of a tubal pregnancy.   It is not always successful.   In vitro fertilization. However, it is:   Very complicated and demanding on the patient.   Not always successful.  PROCEDURE  The PPTL is a surgical procedure. This procedure can be safely performed right after delivery or the day after delivery.   If it is done following a vaginal delivery, it can be done through a small cut (incision) just beneath the belly button. A medicine will be used that numbs the area or puts you to sleep (anesthetic).   If a caesarean section is performed, the decision on whether to have a tubal ligation is usually made before the surgery. This is so the tubal ligation may be done at the same time, after delivery of the baby.   The fallopian tubes are tied off with 2 stitches (sutures).   A small piece of the tube  is removed and then looked at under a microscope to make sure it is the tube.  Different procedures are available for performing the surgery on the tubes. Your surgeon will discuss the pros and cons for PPTL. A PPTL does not require a longer hospital stay. Document Released: 08/03/2005 Document Revised: 07/23/2011 Document Reviewed: 11/21/2008  Sterilization, Women Sterilization is a surgical procedure. This surgery permanently prevents pregnancy in women. This can be done by tying (with or without cutting)  the fallopian tubes or burning the tubes closed (tubal ligation). Tubal ligation blocks the tubes and prevents the egg from being fertilized by the sperm. Sterilization can be done by removing the ovaries that produce the egg (castration) as well. Sterilization is considered safe with very rare complications. It does not affect menstrual periods, sexual desire, or performance.  Since sterilization is considered permanent, you should not do it until you are sure you do not want to have more children. You and your partner should fully agree to have the procedure. Your decision to have the procedure should not be made when you are in a stressful situation. This can include a loss of a pregnancy, illness or death of a spouse, or divorce. There are other means of preventing unwanted pregnancies that can be used until you are completely sure you want to be sterilized. Sterilization does not protect against sexually transmitted disease. Women who had a sterilization procedure and want it reversed must know that it requires an expensive and major operation. The reversal may not be successful and has a high rate of tubal (ectopic) pregnancy that can be dangerous and require surgery. There are several ways to perform a tubal sterlization:  Laparoscopy. The abdomen is filled with a gas to see the pelvic organs. Then, a tube with a light attached is inserted into the abdomen through 2 small incisions. The fallopian tubes are blocked with a ring, clip or electrocautery to burn closed the tubes. Then, the gas is released and the small incisions are closed.   Hysteroscopy. A tube with a light is inserted in the vagina, through the cervix and then into the uterus. A spring-like instrument is inserted into the opening of the fallopian tubes. The spring causes scaring and blocks the tubes. Other forms of contraception should be used for three months at which time an X-ray is done to be sure the tubes are blocked.    Minilaparotomy. This is done right after giving birth. A small incision is made under the belly button and the tubes are exposed. The tubes can then be burned, tied and/or cut.   Tubal ligation can be done during a Cesarean section.  Tubal sterilization should be discussed with your caregiver to answer any concerns you or your partner might have. This meeting will help to decide for sure if the operation is safe for you and which procedure is the best one for you. You can change your mind and cancel the surgery at any time. HOME CARE INSTRUCTIONS   Follow your caregivers instructions regarding diet, rest, work, social and sexual activities and follow up appointments.   Shoulder pain is common following a laparoscopy. The pain may be relieved by lying down flat.   Only take over-the-counter or prescription medicines for pain, discomfort or fever as directed by your caregiver.   You may use lozenges for throat discomfort.   Keep the incisions covered to prevent infection.  SEEK IMMEDIATE MEDICAL CARE IF:   You develop a temperature of 102 F (  38.9 C), or as your caregiver suggests.   You become dizzy or faint.   You start to feel sick to your stomach (nausea) or throw up (vomit).   You develop abdominal pain not relieved with over-the-counter medications.   You have redness and puffiness (swelling) of the cut (incision).   You see pus draining from the incision.   You miss a menstrual period.  Document Released: 01/20/2008 Document Revised: 07/23/2011 Document Reviewed: 01/20/2008 [facility] Patient Information 2012 [place], [place].

## 2012-03-01 NOTE — Progress Notes (Signed)
3 hour GTT today.  No other complaints or concerns.  Fetal movement and labor precautions reviewed.

## 2012-03-02 LAB — GLUCOSE TOLERANCE, 3 HOURS: Glucose Tolerance, Fasting: 88 mg/dL (ref 70–104)

## 2012-03-02 NOTE — Patient Instructions (Signed)
Return to clinic for any obstetric concerns or go to [facility] for evaluation  

## 2012-03-02 NOTE — Progress Notes (Signed)
Normal 3 hr GTT ([phone]), only one abnormal value. Still has impaired glucose tolerance and increased risk of macrosomia, needs to follow carbohydrate modified diet.

## 2012-03-08 DIAGNOSIS — O0990 Supervision of high risk pregnancy, unspecified, unspecified trimester: Secondary | ICD-10-CM

## 2012-03-15 ENCOUNTER — Encounter: Payer: Medicaid Other | Admitting: Obstetrics and Gynecology

## 2012-03-22 ENCOUNTER — Ambulatory Visit (INDEPENDENT_AMBULATORY_CARE_PROVIDER_SITE_OTHER): Payer: Medicaid Other | Admitting: Obstetrics and Gynecology

## 2012-03-22 VITALS — BP 117/62 | Wt 160.0 lb

## 2012-03-22 DIAGNOSIS — Z6791 Unspecified blood type, Rh negative: Secondary | ICD-10-CM

## 2012-03-22 DIAGNOSIS — O4700 False labor before 37 completed weeks of gestation, unspecified trimester: Secondary | ICD-10-CM

## 2012-03-22 DIAGNOSIS — O09529 Supervision of elderly multigravida, unspecified trimester: Secondary | ICD-10-CM

## 2012-03-22 DIAGNOSIS — O0990 Supervision of high risk pregnancy, unspecified, unspecified trimester: Secondary | ICD-10-CM

## 2012-03-22 DIAGNOSIS — O09299 Supervision of pregnancy with other poor reproductive or obstetric history, unspecified trimester: Secondary | ICD-10-CM

## 2012-03-22 DIAGNOSIS — R002 Palpitations: Secondary | ICD-10-CM

## 2012-03-22 DIAGNOSIS — O360990 Maternal care for other rhesus isoimmunization, unspecified trimester, not applicable or unspecified: Secondary | ICD-10-CM

## 2012-03-22 NOTE — Progress Notes (Signed)
Patient doing well, reports occasional cramping pains. Continue Procardia until 34 weeks. FM/PTL precautions reviewed

## 2012-04-05 ENCOUNTER — Ambulatory Visit (INDEPENDENT_AMBULATORY_CARE_PROVIDER_SITE_OTHER): Payer: Medicaid Other | Admitting: Family Medicine

## 2012-04-05 ENCOUNTER — Encounter: Payer: Self-pay | Admitting: Family Medicine

## 2012-04-05 DIAGNOSIS — Z3480 Encounter for supervision of other normal pregnancy, unspecified trimester: Secondary | ICD-10-CM

## 2012-04-05 NOTE — Progress Notes (Signed)
Doing well. Has stopped procardia.  Cultures next visit.

## 2012-04-05 NOTE — Patient Instructions (Signed)
Breastfeeding BENEFITS OF BREASTFEEDING For the baby  The first milk (colostrum) helps the baby's digestive system function better.   There are antibodies from the mother in the milk that help the baby fight off infections.   The baby has a lower incidence of asthma, allergies, and SIDS (sudden infant death syndrome).   The nutrients in breast milk are better than formulas for the baby and helps the baby's brain grow better.   Babies who breastfeed have less gas, colic, and constipation.  For the mother  Breastfeeding helps develop a very special bond between mother and baby.   It is more convenient, always available at the correct temperature and cheaper than formula feeding.   It burns calories in the mother and helps with losing weight that was gained during pregnancy.   It makes the uterus contract back down to normal size faster and slows bleeding following delivery.   Breastfeeding mothers have a lower risk of developing breast cancer.  NURSE FREQUENTLY  A healthy, full-term baby may breastfeed as often as every hour or space his or her feedings to every 3 hours.   How often to nurse will vary from baby to baby. Watch your baby for signs of hunger, not the clock.   Nurse as often as the baby requests, or when you feel the need to reduce the fullness of your breasts.   Awaken the baby if it has been 3 to 4 hours since the last feeding.   Frequent feeding will help the mother make more milk and will prevent problems like sore nipples and engorgement of the breasts.  BABY'S POSITION AT THE BREAST  Whether lying down or sitting, be sure that the baby's tummy is facing your tummy.   Support the breast with 4 fingers underneath the breast and the thumb above. Make sure your fingers are well away from the nipple and baby's mouth.   Stroke the baby's lips and cheek closest to the breast gently with your finger or nipple.   When the baby's mouth is open wide enough, place all  of your nipple and as much of the dark area around the nipple as possible into your baby's mouth.   Pull the baby in close so the tip of the nose and the baby's cheeks touch the breast during the feeding.  FEEDINGS  The length of each feeding varies from baby to baby and from feeding to feeding.   The baby must suck about 2 to 3 minutes for your milk to get to him or her. This is called a "let down." For this reason, allow the baby to feed on each breast as long as he or she wants. Your baby will end the feeding when he or she has received the right balance of nutrients.   To break the suction, put your finger into the corner of the baby's mouth and slide it between his or her gums before removing your breast from his or her mouth. This will help prevent sore nipples.  REDUCING BREAST ENGORGEMENT  In the first week after your baby is born, you may experience signs of breast engorgement. When breasts are engorged, they feel heavy, warm, full, and may be tender to the touch. You can reduce engorgement if you:   Nurse frequently, every 2 to 3 hours. Mothers who breastfeed early and often have fewer problems with engorgement.   Place light ice packs on your breasts between feedings. This reduces swelling. Wrap the ice packs in a   lightweight towel to protect your skin.   Apply moist hot packs to your breast for 5 to 10 minutes before each feeding. This increases circulation and helps the milk flow.   Gently massage your breast before and during the feeding.   Make sure that the baby empties at least one breast at every feeding before switching sides.   Use a breast pump to empty the breasts if your baby is sleepy or not nursing well. You may also want to pump if you are returning to work or or you feel you are getting engorged.   Avoid bottle feeds, pacifiers or supplemental feedings of water or juice in place of breastfeeding.   Be sure the baby is latched on and positioned properly while  breastfeeding.   Prevent fatigue, stress, and anemia.   Wear a supportive bra, avoiding underwire styles.   Eat a balanced diet with enough fluids.  If you follow these suggestions, your engorgement should improve in 24 to 48 hours. If you are still experiencing difficulty, call your lactation consultant or caregiver. IS MY BABY GETTING ENOUGH MILK? Sometimes, mothers worry about whether their babies are getting enough milk. You can be assured that your baby is getting enough milk if:  The baby is actively sucking and you hear swallowing.   The baby nurses at least 8 to 12 times in a 24 hour time period. Nurse your baby until he or she unlatches or falls asleep at the first breast (at least 10 to 20 minutes), then offer the second side.   The baby is wetting 5 to 6 disposable diapers (6 to 8 cloth diapers) in a 24 hour period by 5 to 6 days of age.   The baby is having at least 2 to 3 stools every 24 hours for the first few months. Breast milk is all the food your baby needs. It is not necessary for your baby to have water or formula. In fact, to help your breasts make more milk, it is best not to give your baby supplemental feedings during the early weeks.   The stool should be soft and yellow.   The baby should gain 4 to 7 ounces per week after he is 4 days old.  TAKE CARE OF YOURSELF Take care of your breasts by:  Bathing or showering daily.   Avoiding the use of soaps on your nipples.   Start feedings on your left breast at one feeding and on your right breast at the next feeding.   You will notice an increase in your milk supply 2 to 5 days after delivery. You may feel some discomfort from engorgement, which makes your breasts very firm and often tender. Engorgement "peaks" out within 24 to 48 hours. In the meantime, apply warm moist towels to your breasts for 5 to 10 minutes before feeding. Gentle massage and expression of some milk before feeding will soften your breasts, making  it easier for your baby to latch on. Wear a well fitting nursing bra and air dry your nipples for 10 to 15 minutes after each feeding.   Only use cotton bra pads.   Only use pure lanolin on your nipples after nursing. You do not need to wash it off before nursing.  Take care of yourself by:   Eating well-balanced meals and nutritious snacks.   Drinking milk, fruit juice, and water to satisfy your thirst (about 8 glasses a day).   Getting plenty of rest.   Increasing calcium in   your diet (1200 mg a day).   Avoiding foods that you notice affect the baby in a bad way.  SEEK MEDICAL CARE IF:   You have any questions or difficulty with breastfeeding.   You need help.   You have a hard, red, sore area on your breast, accompanied by a fever of 100.5 F (38.1 C) or more.   Your baby is too sleepy to eat well or is having trouble sleeping.   Your baby is wetting less than 6 diapers per day, by 5 days of age.   Your baby's skin or white part of his or her eyes is more yellow than it was in the hospital.   You feel depressed.  Document Released: 08/03/2005 Document Revised: 07/23/2011 Document Reviewed: 03/18/2009 [employer] Patient Information 2012 [employer]. 

## 2012-04-12 ENCOUNTER — Encounter: Payer: Self-pay | Admitting: Family Medicine

## 2012-04-12 ENCOUNTER — Ambulatory Visit (INDEPENDENT_AMBULATORY_CARE_PROVIDER_SITE_OTHER): Payer: Medicaid Other | Admitting: Family Medicine

## 2012-04-12 VITALS — BP 104/62 | Wt 159.0 lb

## 2012-04-12 DIAGNOSIS — Z3480 Encounter for supervision of other normal pregnancy, unspecified trimester: Secondary | ICD-10-CM

## 2012-04-12 LAB — OB RESULTS CONSOLE GBS: GBS: NEGATIVE

## 2012-04-12 NOTE — Patient Instructions (Signed)

## 2012-04-12 NOTE — Progress Notes (Signed)
Doing well Cultures today 

## 2012-04-15 ENCOUNTER — Encounter: Payer: Self-pay | Admitting: Family Medicine

## 2012-04-15 LAB — CULTURE, BETA STREP (GROUP B ONLY)

## 2012-04-19 ENCOUNTER — Encounter: Payer: Self-pay | Admitting: Obstetrics & Gynecology

## 2012-04-19 ENCOUNTER — Ambulatory Visit (INDEPENDENT_AMBULATORY_CARE_PROVIDER_SITE_OTHER): Payer: Medicaid Other | Admitting: Obstetrics & Gynecology

## 2012-04-19 VITALS — BP 98/65 | Wt 157.0 lb

## 2012-04-19 DIAGNOSIS — I471 Supraventricular tachycardia: Secondary | ICD-10-CM

## 2012-04-19 DIAGNOSIS — O99810 Abnormal glucose complicating pregnancy: Secondary | ICD-10-CM

## 2012-04-19 DIAGNOSIS — O0990 Supervision of high risk pregnancy, unspecified, unspecified trimester: Secondary | ICD-10-CM

## 2012-04-19 DIAGNOSIS — O09529 Supervision of elderly multigravida, unspecified trimester: Secondary | ICD-10-CM

## 2012-04-19 NOTE — Progress Notes (Signed)
Routine prenatal check. 

## 2012-04-19 NOTE — Progress Notes (Signed)
Routine visit. Labor precautions. Good FM. No problems.

## 2012-04-24 ENCOUNTER — Encounter (HOSPITAL_COMMUNITY): Payer: Self-pay | Admitting: *Deleted

## 2012-04-24 ENCOUNTER — Inpatient Hospital Stay (HOSPITAL_COMMUNITY)
Admission: AD | Admit: 2012-04-24 | Discharge: 2012-04-24 | Disposition: A | Discharge status: Home / Self Care | Payer: Medicaid Other | Source: Ambulatory Visit | Attending: Obstetrics & Gynecology | Admitting: Obstetrics & Gynecology

## 2012-04-24 DIAGNOSIS — O99810 Abnormal glucose complicating pregnancy: Secondary | ICD-10-CM | POA: Insufficient documentation

## 2012-04-24 DIAGNOSIS — O09299 Supervision of pregnancy with other poor reproductive or obstetric history, unspecified trimester: Secondary | ICD-10-CM

## 2012-04-24 DIAGNOSIS — O479 False labor, unspecified: Secondary | ICD-10-CM | POA: Insufficient documentation

## 2012-04-24 DIAGNOSIS — O09529 Supervision of elderly multigravida, unspecified trimester: Secondary | ICD-10-CM | POA: Insufficient documentation

## 2012-04-24 DIAGNOSIS — O26899 Other specified pregnancy related conditions, unspecified trimester: Secondary | ICD-10-CM

## 2012-04-24 DIAGNOSIS — O4700 False labor before 37 completed weeks of gestation, unspecified trimester: Secondary | ICD-10-CM

## 2012-04-24 DIAGNOSIS — O0990 Supervision of high risk pregnancy, unspecified, unspecified trimester: Secondary | ICD-10-CM

## 2012-04-24 LAB — URINALYSIS, ROUTINE W REFLEX MICROSCOPIC
Glucose, UA: NEGATIVE mg/dL
Leukocytes, UA: NEGATIVE
Nitrite: NEGATIVE
Protein, ur: NEGATIVE mg/dL
pH: 6 (ref 5.0–8.0)

## 2012-04-24 LAB — COMPREHENSIVE METABOLIC PANEL
BUN: 10 mg/dL (ref 6–23)
CO2: 22 mEq/L (ref 19–32)
Chloride: 102 mEq/L (ref 96–112)
Creatinine, Ser: 0.56 mg/dL (ref 0.50–1.10)
GFR calc non Af Amer: >90 mL/min (ref >90)
Glucose, Bld: 89 mg/dL (ref 70–99)
Total Bilirubin: 0.3 mg/dL (ref 0.3–1.2)

## 2012-04-24 LAB — CBC
Platelets: 221 10*3/uL (ref 150–400)
RBC: 4.31 MIL/uL (ref 3.87–5.11)
WBC: 7.4 10*3/uL (ref 4.0–10.5)

## 2012-04-24 NOTE — Progress Notes (Signed)
Connie Greer is  41 y.o. G2P1001 at [redacted]w[redacted]d presents complaining of swelling in her hands, feet, and face than began yesterday. She states that she had an occipital headache this morning that was relieved by Tylenol. She is concerned that she is developing pre-eclampsia as these sx are similar to her first pregnancy in which she was eclamptic. She reports +FM and irregular ctx.    Obstetrical/Gynecological History: Menstrual History: OB History    Grav Para Term Preterm Abortions TAB SAB Ect Mult Living   2 1 1  0 0 0 0 0 0 1      Patient's last menstrual period was 08/03/2011.     Past Medical History: Past Medical History  Diagnosis Date  . Heart murmur   . Pregnancy induced hypertension   . Heart palpitations   . PSVT (paroxysmal supraventricular tachycardia)     Past Surgical History: Past Surgical History  Procedure Date  . Tonsillectomy     Family History: Family History  Problem Relation Age of Onset  . Hypertension Mother   . Cancer Mother     Cervical  . Hypertension Father   . Anesthesia problems Neg Hx     Social History: History  Substance Use Topics  . Smoking status: Former Smoker -- 0.2 packs/day for 10 years    Quit date: 08/31/2011  . Smokeless tobacco: Never Used  . Alcohol Use: No    Allergies: No Known Allergies  Meds:  Prescriptions prior to admission  Medication Sig Dispense Refill  . acetaminophen (TYLENOL) 500 MG tablet Take 1,000 mg by mouth every 6 (six) hours as needed. For pain      . Prenatal Vit-Fe Fumarate-FA (PRENATAL MULTIVITAMIN) TABS Take 1 tablet by mouth daily.      . rho,D, immune globulin (RHOGAM ULTRA-FILTERED PLUS) 300 MCG INJ Inject 300 mcg into the muscle once.  1 each  0    ROS: See aforementioned   Physical Exam  Blood pressure 108/71, pulse 91, temperature 98.4 F (36.9 C), temperature source Oral, resp. rate 18, height 5\' 3"  (1.6 m), weight 71.396 kg (157 lb 6.4 oz), last menstrual period 08/03/2011. GENERAL:  Well-developed, well-nourished female in no acute distress.  LUNGS: Clear to auscultation bilaterally.  HEART: Regular rate and rhythm. ABDOMEN: Soft, nontender, nondistended, gravid.  EXTREMITIES: Nontender, 1+lower extremity edema, 2+ distal pulses. CERVICAL EXAM:deferred Presentation: cephalic FHT:  Baseline rate 150 bpm   Variability moderate  Accelerations present   Decelerations none Contractions: irregular   Labs: Recent Results (from the past 24 hour(s))  URINALYSIS, ROUTINE W REFLEX MICROSCOPIC   Collection Time   04/24/12 10:54 AM      Component Value Range   Color, Urine YELLOW  YELLOW   APPearance HAZY (*) CLEAR   Specific Gravity, Urine 1.025  1.005 - 1.030   pH 6.0  5.0 - 8.0   Glucose, UA NEGATIVE  NEGATIVE mg/dL   Hgb urine dipstick NEGATIVE  NEGATIVE   Bilirubin Urine NEGATIVE  NEGATIVE   Ketones, ur 15 (*) NEGATIVE mg/dL   Protein, ur NEGATIVE  NEGATIVE mg/dL   Urobilinogen, UA 0.2  0.0 - 1.0 mg/dL   Nitrite NEGATIVE  NEGATIVE   Leukocytes, UA NEGATIVE  NEGATIVE   Results for orders placed during the hospital encounter of 04/24/12 (from the past 24 hour(s))  URINALYSIS, ROUTINE W REFLEX MICROSCOPIC     Status: Abnormal   Collection Time   04/24/12 10:54 AM      Component Value Range   Color,  Urine YELLOW  YELLOW   APPearance HAZY (*) CLEAR   Specific Gravity, Urine 1.025  1.005 - 1.030   pH 6.0  5.0 - 8.0   Glucose, UA NEGATIVE  NEGATIVE mg/dL   Hgb urine dipstick NEGATIVE  NEGATIVE   Bilirubin Urine NEGATIVE  NEGATIVE   Ketones, ur 15 (*) NEGATIVE mg/dL   Protein, ur NEGATIVE  NEGATIVE mg/dL   Urobilinogen, UA 0.2  0.0 - 1.0 mg/dL   Nitrite NEGATIVE  NEGATIVE   Leukocytes, UA NEGATIVE  NEGATIVE  COMPREHENSIVE METABOLIC PANEL     Status: Abnormal   Collection Time   04/24/12 11:52 AM      Component Value Range   Sodium 133 (*) 135 - 145 mEq/L   Potassium 3.8  3.5 - 5.1 mEq/L   Chloride 102  96 - 112 mEq/L   CO2 22  19 - 32 mEq/L   Glucose, Bld 89  70  - 99 mg/dL   BUN 10  6 - 23 mg/dL   Creatinine, Ser 1.61  0.50 - 1.10 mg/dL   Calcium 8.9  8.4 - 09.6 mg/dL   Total Protein 5.6 (*) 6.0 - 8.3 g/dL   Albumin 2.4 (*) 3.5 - 5.2 g/dL   AST 14  0 - 37 U/L   ALT 10  0 - 35 U/L   Alkaline Phosphatase 144 (*) 39 - 117 U/L   Total Bilirubin 0.3  0.3 - 1.2 mg/dL   GFR calc non Af Amer >90  >90 mL/min   GFR calc Af Amer >90  >90 mL/min  CBC     Status: Normal   Collection Time   04/24/12 11:52 AM      Component Value Range   WBC 7.4  4.0 - 10.5 K/uL   RBC 4.31  3.87 - 5.11 MIL/uL   Hemoglobin 12.1  12.0 - 15.0 g/dL   HCT 04.5  40.9 - 81.1 %   MCV 85.2  78.0 - 100.0 fL   MCH 28.1  26.0 - 34.0 pg   MCHC 33.0  30.0 - 36.0 g/dL   RDW 91.4  78.2 - 95.6 %   Platelets 221  150 - 400 K/uL     Imaging Studies:  No results found.  Assessment: Connie Greer [place] Greer is  41 y.o. G2P1001 at [redacted]w[redacted]d presents with swelling and HA. H/o eclampsia with first pregnancy Normotensive; Labs wnl  Plan: Discharge home F/u on Tuesday w/[facility] Labor precautions given   Lawernce Pitts 9/8/201311:52 AM   I examined pt and agree with documentation above and nurse midwife student plan of care. [employer]

## 2012-04-24 NOTE — [facility]U Note (Signed)
Pt c/o headache and swelling and contractions. Pt feels tired and fatigued. Denies vag bleeding or discharge. Reports some watery stools.

## 2012-04-26 ENCOUNTER — Ambulatory Visit (INDEPENDENT_AMBULATORY_CARE_PROVIDER_SITE_OTHER): Payer: Medicaid Other | Admitting: Obstetrics & Gynecology

## 2012-04-26 VITALS — BP 104/65 | Wt 158.4 lb

## 2012-04-26 DIAGNOSIS — O99810 Abnormal glucose complicating pregnancy: Secondary | ICD-10-CM

## 2012-04-26 DIAGNOSIS — I471 Supraventricular tachycardia: Secondary | ICD-10-CM

## 2012-04-26 DIAGNOSIS — R002 Palpitations: Secondary | ICD-10-CM

## 2012-04-26 DIAGNOSIS — O0990 Supervision of high risk pregnancy, unspecified, unspecified trimester: Secondary | ICD-10-CM

## 2012-04-26 DIAGNOSIS — Z6791 Unspecified blood type, Rh negative: Secondary | ICD-10-CM

## 2012-04-26 DIAGNOSIS — O09299 Supervision of pregnancy with other poor reproductive or obstetric history, unspecified trimester: Secondary | ICD-10-CM

## 2012-04-26 DIAGNOSIS — O360990 Maternal care for other rhesus isoimmunization, unspecified trimester, not applicable or unspecified: Secondary | ICD-10-CM

## 2012-04-26 DIAGNOSIS — O4700 False labor before 37 completed weeks of gestation, unspecified trimester: Secondary | ICD-10-CM

## 2012-04-26 DIAGNOSIS — O09529 Supervision of elderly multigravida, unspecified trimester: Secondary | ICD-10-CM

## 2012-04-26 NOTE — Progress Notes (Signed)
No cervical change. No other complaints or concerns.  Fetal movement and labor precautions reviewed.

## 2012-04-26 NOTE — Patient Instructions (Signed)
Return to clinic for any obstetric concerns or go to [facility] for evaluation  

## 2012-05-03 NOTE — Progress Notes (Signed)
05/03/2012 Visit at 39w 1d.  BP 106/71, weight 158 lbs. FHR 148. Cervix 1/thick/-3/post/soft, vertex presentation. No other complaints or concerns.  Fetal movement and labor precautions reviewed.  Postdates testing to start next week if patient is still pregnant.

## 2012-05-04 ENCOUNTER — Encounter (HOSPITAL_COMMUNITY): Payer: Self-pay | Admitting: *Deleted

## 2012-05-04 ENCOUNTER — Inpatient Hospital Stay (HOSPITAL_COMMUNITY)
Admission: AD | Admit: 2012-05-04 | Discharge: 2012-05-04 | Disposition: A | Discharge status: Home / Self Care | Payer: Medicaid Other | Source: Ambulatory Visit | Attending: Obstetrics & Gynecology | Admitting: Obstetrics & Gynecology

## 2012-05-04 DIAGNOSIS — O479 False labor, unspecified: Secondary | ICD-10-CM | POA: Insufficient documentation

## 2012-05-04 DIAGNOSIS — O0990 Supervision of high risk pregnancy, unspecified, unspecified trimester: Secondary | ICD-10-CM

## 2012-05-04 NOTE — MAU Note (Signed)
Spots of blood since yesterday. UC's strong since 0600. Pressure.

## 2012-05-04 NOTE — MAU Provider Note (Signed)
  History     CSN: [id number]  Arrival date and time: 05/04/12 4098   None     Chief Complaint  Patient presents with  . Labor Eval   HPI Comments: 41 y.o. G2P1001 @ 39.2 seen at [facility] presenting for labor evaluation.  Pt reports contractions since yesterday and vaginal pressure.  She has been feeling the contractions irregularly but they are severe when she has them.  She has also had some light vaginal spotting on tissue paper when she wipes; no brisk vaginal bleeding or clots.  + FM, no LOF.  Pt's most recent PNV was yesterday.  Of note, patient has had no complications with this pregnancy but reports eclampsia with her 1st pregnancy in [place] (had seizure after delivery).  Pt denies HA, vision changes, RUQ pain.   U/S 12/15/11: showing placenta POSTERIOR, above cervical os    OB History    Grav Para Term Preterm Abortions TAB SAB Ect Mult Living   2 1 1  0 0 0 0 0 0 1      Past Medical History  Diagnosis Date  . Heart murmur   . Pregnancy induced hypertension   . Heart palpitations   . PSVT (paroxysmal supraventricular tachycardia)     Past Surgical History  Procedure Date  . Tonsillectomy     Family History  Problem Relation Age of Onset  . Hypertension Mother   . Cancer Mother     Cervical  . Hypertension Father   . Anesthesia problems Neg Hx     History  Substance Use Topics  . Smoking status: Former Smoker -- 0.2 packs/day for 10 years    Quit date: 08/31/2011  . Smokeless tobacco: Never Used  . Alcohol Use: No    Allergies: No Known Allergies  Prescriptions prior to admission  Medication Sig Dispense Refill  . acetaminophen (TYLENOL) 500 MG tablet Take 1,000 mg by mouth every 6 (six) hours as needed. For pain      . Prenatal Vit-Fe Fumarate-FA (PRENATAL MULTIVITAMIN) TABS Take 1 tablet by mouth daily.        ROS Physical Exam   Blood pressure 121/71, pulse 92, temperature 97.5 F (36.4 C), temperature source Oral, resp. rate 18, last  menstrual period 08/03/2011.  Physical Exam  Constitutional: She appears well-developed and well-nourished.  Cardiovascular: Normal rate, regular rhythm and intact distal pulses.   No murmur heard. Respiratory: Effort normal and breath sounds normal. No respiratory distress.  GI: There is no tenderness.       gravid  Musculoskeletal: She exhibits no edema.  Skin: Skin is warm and dry.   FHT: 140-150, moderate variability, + accels Toco: irregular ctx, q6-12 min Dilation: Fingertip (outer os 2cm) Effacement (%): 60 Cervical Position: Posterior Station: -3 Presentation: Vertex Exam by:: Sarajane Marek, RNC   MAU Course  Procedures  MDM Cervix checked by nursing- FT and posterior, -3 Pt would like to walk; will recheck cervix after 2 hours Reassuring FHT, category 1  12:30pm: Cervix unchanged on recheck, still FT/60/-3 and posterior  Assessment and Plan  41 yo G2P1 @ 39.2 here for labor evaluation, no in active labor -D/c home with labor precautions and kick counts  -F/u at [facility] as previously scheduled  MCGILL,JACQUELYN 05/04/2012, 9:20 AM   Evaluation and management procedures were performed by Resident physician under my supervision/collaboration. Chart reviewed, patient examined by me and I agree with management and plan.

## 2012-05-06 NOTE — MAU Provider Note (Signed)
Attestation of Attending Supervision of Advanced Practitioner (CNM/NP): Evaluation and management procedures were performed by the Advanced Practitioner under my supervision and collaboration.  I have reviewed the Advanced Practitioner's note and chart, and I agree with the management and plan.  HARRAWAY-SMITH, Trevelle Mcgurn 9:56 AM

## 2012-05-10 ENCOUNTER — Encounter: Payer: Medicaid Other | Admitting: Obstetrics & Gynecology

## 2012-05-10 ENCOUNTER — Encounter (HOSPITAL_COMMUNITY): Payer: Self-pay | Admitting: *Deleted

## 2012-05-10 ENCOUNTER — Inpatient Hospital Stay (HOSPITAL_COMMUNITY)
Admission: AD | Admit: 2012-05-10 | Discharge: 2012-05-10 | Disposition: A | Discharge status: Home / Self Care | Payer: Medicaid Other | Source: Ambulatory Visit | Attending: Obstetrics and Gynecology | Admitting: Obstetrics and Gynecology

## 2012-05-10 DIAGNOSIS — O479 False labor, unspecified: Secondary | ICD-10-CM | POA: Insufficient documentation

## 2012-05-10 NOTE — MAU Note (Signed)
Pt presents with complaints of contractions since 4am. Pt denies any bleeding or leakage of fluid. States baby is active

## 2012-05-11 ENCOUNTER — Encounter: Payer: Self-pay | Admitting: Obstetrics & Gynecology

## 2012-05-11 ENCOUNTER — Encounter (HOSPITAL_COMMUNITY): Payer: Self-pay | Admitting: *Deleted

## 2012-05-11 ENCOUNTER — Telehealth (HOSPITAL_COMMUNITY): Payer: Self-pay | Admitting: *Deleted

## 2012-05-11 ENCOUNTER — Encounter: Payer: Medicaid Other | Admitting: Obstetrics & Gynecology

## 2012-05-11 ENCOUNTER — Ambulatory Visit (INDEPENDENT_AMBULATORY_CARE_PROVIDER_SITE_OTHER): Payer: Medicaid Other | Admitting: Obstetrics & Gynecology

## 2012-05-11 VITALS — BP 125/81 | Wt 160.0 lb

## 2012-05-11 DIAGNOSIS — O09529 Supervision of elderly multigravida, unspecified trimester: Secondary | ICD-10-CM

## 2012-05-11 DIAGNOSIS — O09299 Supervision of pregnancy with other poor reproductive or obstetric history, unspecified trimester: Secondary | ICD-10-CM

## 2012-05-11 DIAGNOSIS — O0990 Supervision of high risk pregnancy, unspecified, unspecified trimester: Secondary | ICD-10-CM

## 2012-05-11 NOTE — Progress Notes (Signed)
Routine visit. She was seen in the MAU yesterday for r/o labor. She is very concerned that she will have eclampsia again (as she reports with her last pregnancy). She denies HA, visual changes, or RUQ pain. DTRs 0 BL. No edema. No proteinuria. I will check pre eclampsia labs today since she is concerned and will schedule IOL at 41 weeks. (7:30 pm)  I got a second opinion with Dr. Debroah Loop who agrees with this plan.

## 2012-05-11 NOTE — Telephone Encounter (Signed)
Preadmission screen  

## 2012-05-12 ENCOUNTER — Ambulatory Visit (INDEPENDENT_AMBULATORY_CARE_PROVIDER_SITE_OTHER): Payer: Medicaid Other | Admitting: Obstetrics & Gynecology

## 2012-05-12 ENCOUNTER — Other Ambulatory Visit: Payer: Self-pay | Admitting: Obstetrics & Gynecology

## 2012-05-12 ENCOUNTER — Ambulatory Visit (HOSPITAL_COMMUNITY)
Admission: RE | Admit: 2012-05-12 | Discharge: 2012-05-12 | Disposition: A | Discharge status: Home / Self Care | Payer: Medicaid Other | Source: Ambulatory Visit | Attending: Obstetrics & Gynecology | Admitting: Obstetrics & Gynecology

## 2012-05-12 DIAGNOSIS — O09529 Supervision of elderly multigravida, unspecified trimester: Secondary | ICD-10-CM | POA: Insufficient documentation

## 2012-05-12 DIAGNOSIS — Z8751 Personal history of pre-term labor: Secondary | ICD-10-CM | POA: Insufficient documentation

## 2012-05-12 DIAGNOSIS — O169 Unspecified maternal hypertension, unspecified trimester: Secondary | ICD-10-CM

## 2012-05-12 DIAGNOSIS — O09299 Supervision of pregnancy with other poor reproductive or obstetric history, unspecified trimester: Secondary | ICD-10-CM | POA: Insufficient documentation

## 2012-05-12 DIAGNOSIS — Z3480 Encounter for supervision of other normal pregnancy, unspecified trimester: Secondary | ICD-10-CM

## 2012-05-12 DIAGNOSIS — O288 Other abnormal findings on antenatal screening of mother: Secondary | ICD-10-CM

## 2012-05-12 LAB — COMPREHENSIVE METABOLIC PANEL
AST: 12 U/L (ref 0–37)
Albumin: 3.1 g/dL — ABNORMAL LOW (ref 3.5–5.2)
BUN: 9 mg/dL (ref 6–23)
Calcium: 9 mg/dL (ref 8.4–10.5)
Chloride: 106 mEq/L (ref 96–112)
Glucose, Bld: 84 mg/dL (ref 70–99)
Potassium: 4.9 mEq/L (ref 3.5–5.3)
Sodium: 140 mEq/L (ref 135–145)
Total Protein: 5.8 g/dL — ABNORMAL LOW (ref 6.0–8.3)

## 2012-05-12 LAB — CBC
HCT: 36.9 % (ref 36.0–46.0)
Hemoglobin: 12.4 g/dL (ref 12.0–15.0)
RDW: 14.7 % (ref 11.5–15.5)
WBC: 8.1 10*3/uL (ref 4.0–10.5)

## 2012-05-12 NOTE — Progress Notes (Signed)
She is here for a follow up visit. Her labs were all normal. Her DTRs are still 0 today with no edema. Her IOL is set up for Monday. All questions answered. Her NST was reassuring but not reactive, so I will send her to [facility] for a BPP. The results will be called to [phone] prior to her departure from the hospital.

## 2012-05-13 LAB — CREATININE CLEARANCE, URINE, 24 HOUR: Creatinine: 0.58 mg/dL (ref 0.50–1.10)

## 2012-05-16 ENCOUNTER — Encounter (HOSPITAL_COMMUNITY): Payer: Self-pay

## 2012-05-16 ENCOUNTER — Inpatient Hospital Stay (HOSPITAL_COMMUNITY)
Admission: RE | Admit: 2012-05-16 | Discharge: 2012-05-20 | DRG: 767 | Disposition: A | Discharge status: Home / Self Care | Payer: Medicaid Other | Source: Ambulatory Visit | Attending: Obstetrics & Gynecology | Admitting: Obstetrics & Gynecology

## 2012-05-16 VITALS — BP 119/68 | HR 79 | Temp 97.5°F | Resp 18 | Ht 62.0 in | Wt 161.0 lb

## 2012-05-16 DIAGNOSIS — O09529 Supervision of elderly multigravida, unspecified trimester: Secondary | ICD-10-CM | POA: Diagnosis present

## 2012-05-16 DIAGNOSIS — Z6791 Unspecified blood type, Rh negative: Secondary | ICD-10-CM

## 2012-05-16 DIAGNOSIS — O09299 Supervision of pregnancy with other poor reproductive or obstetric history, unspecified trimester: Secondary | ICD-10-CM

## 2012-05-16 DIAGNOSIS — Z302 Encounter for sterilization: Secondary | ICD-10-CM

## 2012-05-16 DIAGNOSIS — O4700 False labor before 37 completed weeks of gestation, unspecified trimester: Secondary | ICD-10-CM

## 2012-05-16 DIAGNOSIS — O480 Post-term pregnancy: Principal | ICD-10-CM | POA: Diagnosis present

## 2012-05-16 DIAGNOSIS — O0990 Supervision of high risk pregnancy, unspecified, unspecified trimester: Secondary | ICD-10-CM

## 2012-05-16 DIAGNOSIS — O99810 Abnormal glucose complicating pregnancy: Secondary | ICD-10-CM

## 2012-05-17 ENCOUNTER — Encounter (HOSPITAL_COMMUNITY): Payer: Self-pay | Admitting: Anesthesiology

## 2012-05-17 ENCOUNTER — Inpatient Hospital Stay (HOSPITAL_COMMUNITY): Payer: Medicaid Other | Admitting: Anesthesiology

## 2012-05-17 LAB — CBC
HCT: 37.2 % (ref 36.0–46.0)
MCH: 28.6 pg (ref 26.0–34.0)
MCV: 87.1 fL (ref 78.0–100.0)
RDW: 14.6 % (ref 11.5–15.5)
WBC: 8.8 10*3/uL (ref 4.0–10.5)

## 2012-05-17 LAB — TYPE AND SCREEN
ABO/RH(D): B NEG
Antibody Screen: NEGATIVE

## 2012-05-17 MED ORDER — OXYTOCIN 40 UNITS IN LACTATED RINGERS INFUSION - SIMPLE MED
1.0000 m[IU]/min | INTRAVENOUS | Status: DC
  Filled 2012-05-17: qty 1000

## 2012-05-17 MED ORDER — FENTANYL CITRATE 0.05 MG/ML IJ SOLN
100.0000 ug | INTRAMUSCULAR | Status: DC | PRN
  Administered 2012-05-17: 100 ug via INTRAVENOUS
  Filled 2012-05-17: qty 2

## 2012-05-17 MED ORDER — LIDOCAINE HCL (PF) 1 % IJ SOLN: 30.0000 mL | INTRAMUSCULAR | Status: DC | PRN

## 2012-05-17 MED ORDER — DIPHENHYDRAMINE HCL 50 MG/ML IJ SOLN: 12.5000 mg | INTRAMUSCULAR | Status: DC | PRN

## 2012-05-17 MED ORDER — OXYTOCIN 40 UNITS IN LACTATED RINGERS INFUSION - SIMPLE MED: 62.5000 mL/h | Freq: Once | INTRAVENOUS | Status: DC

## 2012-05-17 MED ORDER — LACTATED RINGERS IV SOLN
INTRAVENOUS | Status: DC
  Administered 2012-05-17 – 2012-05-18 (×5): via INTRAVENOUS

## 2012-05-17 MED ORDER — EPHEDRINE 5 MG/ML INJ: 10.0000 mg | INTRAVENOUS | Status: DC | PRN

## 2012-05-17 MED ORDER — BUTORPHANOL TARTRATE 1 MG/ML IJ SOLN
2.0000 mg | INTRAMUSCULAR | Status: DC | PRN
  Administered 2012-05-17 – 2012-05-18 (×3): 2 mg via INTRAVENOUS
  Filled 2012-05-17 (×3): qty 2

## 2012-05-17 MED ORDER — PHENYLEPHRINE 40 MCG/ML (10ML) SYRINGE FOR IV PUSH (FOR BLOOD PRESSURE SUPPORT)
80.0000 ug | PREFILLED_SYRINGE | INTRAVENOUS | Status: DC | PRN
  Administered 2012-05-18: 80 ug via INTRAVENOUS
  Filled 2012-05-17 (×4): qty 5

## 2012-05-17 MED ORDER — OXYTOCIN BOLUS FROM INFUSION
500.0000 mL | Freq: Once | INTRAVENOUS | Status: DC
  Filled 2012-05-17: qty 500

## 2012-05-17 MED ORDER — TERBUTALINE SULFATE 1 MG/ML IJ SOLN: 0.2500 mg | Freq: Once | INTRAMUSCULAR | Status: DC | PRN

## 2012-05-17 MED ORDER — ACETAMINOPHEN 325 MG PO TABS
650.0000 mg | ORAL_TABLET | ORAL | Status: DC | PRN
  Administered 2012-05-18 (×2): 650 mg via ORAL
  Filled 2012-05-17 (×3): qty 2

## 2012-05-17 MED ORDER — MISOPROSTOL 25 MCG QUARTER TABLET
25.0000 ug | ORAL_TABLET | ORAL | Status: DC | PRN
  Administered 2012-05-17: 25 ug via VAGINAL
  Filled 2012-05-17: qty 0.25

## 2012-05-17 MED ORDER — TERBUTALINE SULFATE 1 MG/ML IJ SOLN: 0.2500 mg | Freq: Once | INTRAMUSCULAR | Status: AC | PRN

## 2012-05-17 MED ORDER — PHENYLEPHRINE 40 MCG/ML (10ML) SYRINGE FOR IV PUSH (FOR BLOOD PRESSURE SUPPORT)
80.0000 ug | PREFILLED_SYRINGE | INTRAVENOUS | Status: DC | PRN
  Administered 2012-05-18: 80 ug via INTRAVENOUS

## 2012-05-17 MED ORDER — IBUPROFEN 600 MG PO TABS: 600.0000 mg | ORAL_TABLET | Freq: Four times a day (QID) | ORAL | Status: DC | PRN

## 2012-05-17 MED ORDER — LACTATED RINGERS IV SOLN: 500.0000 mL | Freq: Once | INTRAVENOUS | Status: DC

## 2012-05-17 MED ORDER — CITRIC ACID-SODIUM CITRATE 334-500 MG/5ML PO SOLN: 30.0000 mL | ORAL | Status: DC | PRN

## 2012-05-17 MED ORDER — FENTANYL 2.5 MCG/ML BUPIVACAINE 1/10 % EPIDURAL INFUSION (WH - ANES)
14.0000 mL/h | INTRAMUSCULAR | Status: DC
Start: 2012-05-17 — End: 2012-05-19
  Administered 2012-05-18: 16 mL/h via EPIDURAL
  Administered 2012-05-18 (×2): 14 mL/h via EPIDURAL
  Filled 2012-05-17 (×4): qty 123

## 2012-05-17 MED ORDER — EPHEDRINE 5 MG/ML INJ
10.0000 mg | INTRAVENOUS | Status: DC | PRN
  Filled 2012-05-17 (×2): qty 4

## 2012-05-17 MED ORDER — ONDANSETRON HCL 4 MG/2ML IJ SOLN: 4.0000 mg | Freq: Four times a day (QID) | INTRAMUSCULAR | Status: DC | PRN

## 2012-05-17 MED ORDER — FAMOTIDINE IN NACL 20-0.9 MG/50ML-% IV SOLN
20.0000 mg | Freq: Two times a day (BID) | INTRAVENOUS | Status: DC
  Filled 2012-05-17 (×6): qty 50

## 2012-05-17 MED ORDER — LACTATED RINGERS IV SOLN
500.0000 mL | INTRAVENOUS | Status: DC | PRN
  Administered 2012-05-18 (×3): 500 mL via INTRAVENOUS

## 2012-05-17 MED ORDER — ZOLPIDEM TARTRATE 5 MG PO TABS
5.0000 mg | ORAL_TABLET | Freq: Every evening | ORAL | Status: DC | PRN
  Administered 2012-05-17: 5 mg via ORAL
  Filled 2012-05-17: qty 1

## 2012-05-17 MED ORDER — OXYCODONE-ACETAMINOPHEN 5-325 MG PO TABS: 1.0000 | ORAL_TABLET | ORAL | Status: DC | PRN

## 2012-05-17 NOTE — Progress Notes (Signed)
Connie Greer is a 41 y.o. G2P1001 at [redacted]w[redacted]d admitted for induction of labor due to Post dates. Due date 05/09/12.  Subjective: Uncomfortable w/ctx, desires epidural  Objective: BP 104/59  Pulse 84  Temp 98.3 F (36.8 C) (Oral)  Resp 20  Ht 5\' 2"  (1.575 m)  Wt 73.029 kg (161 lb)  BMI 29.45 kg/m2  LMP 08/03/2011      FHT:  FHR: 130 bpm, variability: moderate,  accelerations:  Abscent,  decelerations:  Absent UC:   regular, every 1-5 minutes SVE:   Dilation: 5 Effacement (%): 60 Station: -3 Exam by:: Bascom Levels, Cnm  Labs: Lab Results  Component Value Date   WBC 8.8 05/16/2012   HGB 12.2 05/16/2012   HCT 37.2 05/16/2012   MCV 87.1 05/16/2012   PLT 246 05/16/2012    Assessment / Plan: Induction of labor due to postterm,  progressing well on pitocin  Labor: Progressing normally on Pitocin Fetal Wellbeing:  Category II Pain Control:  Labor support without medications I/D:  n/a Anticipated MOD:  NSVD  Connie Greer 05/17/2012, 11:21 PM

## 2012-05-17 NOTE — Progress Notes (Signed)
[place] is a 41 y.o. G2P1001 at [redacted]w[redacted]d admitted for induction of labor due to Post dates.   Subjective: Pt appears somewhat uncomfortable with contractions.   Objective: BP 111/55  Pulse 77  Temp 98.3 F (36.8 C) (Oral)  Resp 20  Ht 5\' 2"  (1.575 m)  Wt 161 lb (73.029 kg)  BMI 29.45 kg/m2  LMP 08/03/2011      FHT:  FHR: 135 bpm, variability: moderate,  accelerations:  Present,  decelerations:  Absent UC:   irregular SVE:   Dilation: 1 Effacement (%): 70 Station: Ballotable Exam by:: kuneff  Foley bulb placed, pt tolerated well  Labs: Lab Results  Component Value Date   WBC 8.8 05/16/2012   HGB 12.2 05/16/2012   HCT 37.2 05/16/2012   MCV 87.1 05/16/2012   PLT 246 05/16/2012    Assessment / Plan: IOL for postdates, contracting s/p Cytotec, foley bulb placed  Fetal Wellbeing:  Category I Pain Control:  epidural or IV meds as desired Anticipated MOD:  NSVD  FRAZIER,NATALIE 05/17/2012, 1:12 PM

## 2012-05-17 NOTE — H&P (Signed)
[place] is a 41 y.o. female presenting for Induction of labor at 41.1wks. Maternal Medical History:  Reason for admission: Reason for Admission:   nauseaIOL post-dates  Contractions: Frequency: irregular.   Duration is approximately 1 minute.   Perceived severity is mild.    Fetal activity: Perceived fetal activity is normal.   Last perceived fetal movement was within the past hour.    Prenatal complications: no prenatal complications Prenatal Complications - Diabetes: none.    OB History    Grav Para Term Preterm Abortions TAB SAB Ect Mult Living   2 1 1  0 0 0 0 0 0 1     Past Medical History  Diagnosis Date  . Heart murmur   . Pregnancy induced hypertension   . Heart palpitations   . PSVT (paroxysmal supraventricular tachycardia)   . Preterm labor    Past Surgical History  Procedure Date  . Tonsillectomy    Family History: family history includes Cancer in her mother and Hypertension in her father and mother.  There is no history of Anesthesia problems. Social History:  reports that she quit smoking about 8 months ago. She has never used smokeless tobacco. She reports that she does not drink alcohol or use illicit drugs.   Prenatal Transfer Tool  Maternal Diabetes: No Genetic Screening: Declined Maternal Ultrasounds/Referrals: Normal Maternal Substance Abuse:  No Significant Maternal Medications:  None Significant Maternal Lab Results:  Lab values include: Group B Strep negative Other Comments:  None  Review of Systems  Constitutional: Negative for fever.  Eyes: Negative for blurred vision.  Respiratory: Positive for shortness of breath.        Mild orthopnea  Cardiovascular: Negative for chest pain and palpitations.  Gastrointestinal: Positive for heartburn and abdominal pain. Negative for nausea, vomiting, diarrhea and constipation.       Epigastric, worse with meals  Genitourinary: Negative for dysuria.  Neurological: Negative for seizures and  headaches.    Dilation: 1 Effacement (%): Thick Station: -3 Exam by:: L. Leftwich-Kirby, CNM/B. Kyden Potash Blood pressure 123/74, pulse 102, temperature 98.2 F (36.8 C), temperature source Axillary, resp. rate 20, height 5\' 2"  (1.575 m), weight 73.029 kg (161 lb), last menstrual period 08/03/2011. Maternal Exam:  Uterine Assessment: Contraction strength is mild.  Contraction duration is 2 minutes. Contraction frequency is irregular.   Abdomen: Patient reports generalized tenderness.  Fundal height is appropriate for dates.    Introitus: Normal vulva. Normal vagina.  Vagina is negative for discharge.  Ferning test: not done.  Nitrazine test: not done. Amniotic fluid character: not assessed.  Cervix: Cervix evaluated by digital exam.     Fetal Exam Fetal Monitor Review: Mode: ultrasound.   Baseline rate: 160.  Variability: moderate (6-25 bpm).   Pattern: accelerations present and no decelerations.    Fetal State Assessment: Category I - tracings are normal.     Physical Exam  Constitutional: She is oriented to person, place, and time. She appears well-developed and well-nourished.  Cardiovascular: Normal rate, regular rhythm and normal heart sounds.   No murmur heard. Respiratory: Effort normal and breath sounds normal.  GI: Soft. Bowel sounds are normal. There is generalized tenderness.  Genitourinary: Vagina normal. Guaiac negative stool. No vaginal discharge found.  Neurological: She is alert and oriented to person, place, and time.  Dilation: 1 Effacement (%): Thick Cervical Position: Posterior Station: -3 Presentation: Vertex Exam by:: L. Leftwich-Kirby, CNM/B. Naome Brigandi  Prenatal labs: ABO, Rh: --/--/B NEG (09/30 2030) Antibody: NEG (09/30 2030) Rubella: 75.7 (  04/09 1653) RPR: NON REAC (07/02 1659)  HBsAg: NEGATIVE (04/09 1653)  HIV: NON REACTIVE (07/02 1659)  GBS: Negative (08/27 0000)  Glucose screen: 1hr 151, normal 3 hour  Assessment/Plan: 40yoF  G2P1001@[redacted]w[redacted]d  presents for IOL at 41.1wks  1. induction of labor - cervix 1 cm dilated, started cytotec - ambien to assist sleep - anticipate vaginal delivery  2. epigastric pain - history suggests acid reflux, started pepcid   Sonia Side 05/17/2012, 12:44 AM   Patient seen and reviewed with Sharen Counter, CNM

## 2012-05-17 NOTE — Progress Notes (Signed)
[place] is a 41 y.o. G2P1001 at [redacted]w[redacted]d admitted for induction of labor due to Post dates.   Subjective: Pt uncomfortable and breathing with some contractions.  S/O at bedside for support.  Objective: BP 101/79  Pulse 74  Temp 97.9 F (36.6 C) (Oral)  Resp 20  Ht 5\' 2"  (1.575 m)  Wt 73.029 kg (161 lb)  BMI 29.45 kg/m2  LMP 08/03/2011      FHT:  FHR: 140 bpm, variability: moderate,  accelerations:  Present,  decelerations:  Absent UC:   irregular, every 2-5 minutes SVE:   Dilation: 1 Effacement (%): 50 Station: Ballotable;-3 Exam by:: L. Leftwich-Kirby, CNM/   Labs: Lab Results  Component Value Date   WBC 8.8 05/16/2012   HGB 12.2 05/16/2012   HCT 37.2 05/16/2012   MCV 87.1 05/16/2012   PLT 246 05/16/2012    Assessment / Plan: Induction of labor due to postterm Cytotec x1 dose  Labor: Progressing normally Preeclampsia:  N/A Fetal Wellbeing:  Category I Pain Control:  Labor support without medications I/D:  n/a Anticipated MOD:  NSVD  LEFTWICH-KIRBY, Joanmarie Tsang 05/17/2012, 8:44 AM

## 2012-05-17 NOTE — Progress Notes (Signed)
  [place] is a 41 y.o. G2P1001 at [redacted]w[redacted]d admitted for induction of labor due to Post dates.   Subjective: Pt uncomfortable and breathing with some contractions. Patient was walking around the room and in the bathroom.  Objective: BP 111/55  Pulse 77  Temp 98.3 F (36.8 C) (Oral)  Resp 20  Ht 5\' 2"  (1.575 m)  Wt 73.029 kg (161 lb)  BMI 29.45 kg/m2  LMP 08/03/2011      FHT:  FHR: 140 bpm, variability: moderate,  accelerations:  Present,  decelerations:  Absent UC:   irregular, every 2-5 minutes SVE:   Dilation: 1 Effacement (%): 70 Station: Ballotable Exam by:: [place]  Labs: Lab Results  Component Value Date   WBC 8.8 05/16/2012   HGB 12.2 05/16/2012   HCT 37.2 05/16/2012   MCV 87.1 05/16/2012   PLT 246 05/16/2012    Assessment / Plan: Induction of labor due to postterm Cytotec x dose  Labor: No progression Preeclampsia:  N/A Fetal Wellbeing:  Category I Pain Control:  Labor support without medications I/D:  n/a Anticipated MOD:  NSVD  Kuneff, Renee 05/17/2012, 12:27 PM  I saw and examined patient along with student and agree with above note.   Alayasia Breeding 05/17/2012 5:01 PM

## 2012-05-17 NOTE — Progress Notes (Signed)
Connie Greer [place] is a 41 y.o. G2P1001 at [redacted]w[redacted]d admitted for induction of labor due to Post dates.  Subjective: Pt is feeling stronger contractions and pressure. Foley bulb still in place.   Objective: BP 105/61  Pulse 82  Temp 98.3 F (36.8 C) (Oral)  Resp 20  Ht 5\' 2"  (1.575 m)  Wt 161 lb (73.029 kg)  BMI 29.45 kg/m2  LMP 08/03/2011      FHT:  FHR: 135 bpm, variability: moderate,  accelerations:  Present,  decelerations:  Absent UC:   irregular, every 2-6 minutes SVE:   Dilation: 5 Effacement (%): 60 Station: -3 Exam by:: Connie Greer, Connie Greer Foley bulb out  Labs: Lab Results  Component Value Date   WBC 8.8 05/16/2012   HGB 12.2 05/16/2012   HCT 37.2 05/16/2012   MCV 87.1 05/16/2012   PLT 246 05/16/2012    Assessment / Plan: IOL for postdates  Labor: start pitocin  Fetal Wellbeing:  Category I Pain Control:  as desired Anticipated MOD:  NSVD  Connie Greer 05/17/2012, 5:31 PM

## 2012-05-17 NOTE — Anesthesia Preprocedure Evaluation (Signed)
Anesthesia Evaluation  Patient identified by MRN, date of birth, ID band Patient awake    Reviewed: Allergy & Precautions, H&P , Patient's Chart, lab work & pertinent test results  Airway Mallampati: III TM Distance: >3 FB Neck ROM: Full    Dental No notable dental hx. (+) Teeth Intact   Pulmonary  Pulmonary Nodule  breath sounds clear to auscultation  Pulmonary exam normal       Cardiovascular + dysrhythmias Supra Ventricular Tachycardia Rhythm:Regular Rate:Normal     Neuro/Psych negative neurological ROS  negative psych ROS   GI/Hepatic negative GI ROS, Neg liver ROS,   Endo/Other  negative endocrine ROS  Renal/GU negative Renal ROS  negative genitourinary   Musculoskeletal negative musculoskeletal ROS (+)   Abdominal   Peds  Hematology negative hematology ROS (+)   Anesthesia Other Findings   Reproductive/Obstetrics (+) Pregnancy                           Anesthesia Physical Anesthesia Plan  ASA: II  Anesthesia Plan: Epidural   Post-op Pain Management:    Induction:   Airway Management Planned: Natural Airway  Additional Equipment:   Intra-op Plan:   Post-operative Plan:   Informed Consent: I have reviewed the patients History and Physical, chart, labs and discussed the procedure including the risks, benefits and alternatives for the proposed anesthesia with the patient or authorized representative who has indicated his/her understanding and acceptance.   Dental advisory given  Plan Discussed with: Anesthesiologist and Surgeon  Anesthesia Plan Comments:         Anesthesia Quick Evaluation

## 2012-05-18 ENCOUNTER — Encounter (HOSPITAL_COMMUNITY): Payer: Self-pay

## 2012-05-18 DIAGNOSIS — O480 Post-term pregnancy: Secondary | ICD-10-CM

## 2012-05-18 DIAGNOSIS — O09529 Supervision of elderly multigravida, unspecified trimester: Secondary | ICD-10-CM

## 2012-05-18 MED ORDER — ACETAMINOPHEN 325 MG PO TABS
650.0000 mg | ORAL_TABLET | Freq: Once | ORAL | Status: AC
  Administered 2012-05-18: 650 mg via ORAL

## 2012-05-18 MED ORDER — GENTAMICIN SULFATE 40 MG/ML IJ SOLN
120.0000 mg | Freq: Three times a day (TID) | INTRAVENOUS | Status: DC
  Administered 2012-05-18: 120 mg via INTRAVENOUS
  Filled 2012-05-18 (×4): qty 3

## 2012-05-18 MED ORDER — IBUPROFEN 600 MG PO TABS
600.0000 mg | ORAL_TABLET | Freq: Four times a day (QID) | ORAL | Status: DC
  Administered 2012-05-18 – 2012-05-19 (×4): 600 mg via ORAL
  Filled 2012-05-18 (×6): qty 1

## 2012-05-18 MED ORDER — SODIUM CHLORIDE 0.9 % IV SOLN
2.0000 g | Freq: Once | INTRAVENOUS | Status: AC
  Administered 2012-05-18: 2 g via INTRAVENOUS
  Filled 2012-05-18: qty 2000

## 2012-05-18 MED ORDER — PROMETHAZINE HCL 25 MG/ML IJ SOLN
12.5000 mg | Freq: Four times a day (QID) | INTRAMUSCULAR | Status: DC | PRN
  Administered 2012-05-18: 12.5 mg via INTRAVENOUS
  Filled 2012-05-18: qty 1

## 2012-05-18 MED ORDER — SODIUM BICARBONATE 8.4 % IV SOLN
INTRAVENOUS | Status: DC | PRN
  Administered 2012-05-18: 5 mL via EPIDURAL

## 2012-05-18 MED ORDER — OXYTOCIN 40 UNITS IN LACTATED RINGERS INFUSION - SIMPLE MED: 1.0000 m[IU]/min | INTRAVENOUS | Status: DC

## 2012-05-18 MED ORDER — OXYCODONE-ACETAMINOPHEN 5-325 MG PO TABS
1.0000 | ORAL_TABLET | ORAL | Status: DC | PRN
  Administered 2012-05-18 – 2012-05-19 (×3): 1 via ORAL
  Administered 2012-05-19 – 2012-05-20 (×5): 2 via ORAL
  Filled 2012-05-18 (×2): qty 2
  Filled 2012-05-18: qty 1
  Filled 2012-05-18 (×2): qty 2
  Filled 2012-05-18: qty 1
  Filled 2012-05-18 (×2): qty 2

## 2012-05-18 NOTE — Progress Notes (Signed)
Notified Dr. Malen Gauze. Will notify CRNA

## 2012-05-18 NOTE — Progress Notes (Signed)
FHR 90 x 40 seconds. Melanie SNM at bedside

## 2012-05-18 NOTE — Progress Notes (Signed)
I have seen and examined this patient and I agree with the above. Eval MVUs for ctx adequacy and then increase Pitocin as needed. Cam Hai 8:09 AM 05/18/2012

## 2012-05-18 NOTE — Progress Notes (Signed)
Updated of pt's pain and pending epidural re dose, T was 100.5, now 99.5 again. Tylenol given for HA, UCs increasing in strength and intensity.

## 2012-05-18 NOTE — Progress Notes (Signed)
FHR 50 x 10 seconds with gradual return to baseline.

## 2012-05-18 NOTE — Progress Notes (Signed)
Connie Greer [place] is a 41 y.o. G2P1001 at [redacted]w[redacted]d.  Subjective: Pt is uncomfortable and feeling increased rectal pressure.   Objective: BP 106/62  Pulse 123  Temp 98.9 F (37.2 C) (Axillary)  Resp 20  Ht 5\' 2"  (1.575 m)  Wt 73.029 kg (161 lb)  BMI 29.45 kg/m2  LMP 08/03/2011      FHT:  FHR: 155 bpm, variability: minimal ,  accelerations:  Present,  decelerations:  Absent UC:   regular, every 2-4  minutes SVE:  8/100 (posteriorly), 80 (anteriorly)/-1  Labs: Lab Results  Component Value Date   WBC 8.8 05/16/2012   HGB 12.2 05/16/2012   HCT 37.2 05/16/2012   MCV 87.1 05/16/2012   PLT 246 05/16/2012    Assessment / Plan:   Labor: Progressing and cervical swelling is greatly reduced.   Fetal Wellbeing:  Category I Pain Control:  Epidural  Anticipated MOD:  NSVD  Tawnya Crook 05/18/2012, 4:55 PM

## 2012-05-18 NOTE — Progress Notes (Signed)
Ivonne Andrew CNM updated pt asking for c section. Cervix ~6 cm through the night. UCs inadequate despite Pit increases. Epidural re dosed x3. Will come recheck and talk with pt.

## 2012-05-18 NOTE — Progress Notes (Signed)
Connie Greer [place] is a 41 y.o. G2P1001 at [redacted]w[redacted]d admitted for induction of labor due to Postdates and hx of eclampsia with prior pregnancy.  Subjective: Pt is not comfortable with an epidural. She is breathing through contractions. She states she is exhausted and "ready to be done".  Objective: BP 126/93  Pulse 127  Temp 99.5 F (37.5 C) (Axillary)  Resp 13  Ht 5\' 2"  (1.575 m)  Wt 73.029 kg (161 lb)  BMI 29.45 kg/m2  LMP 08/03/2011      FHT:  FHR: 145 bpm, variability: minimal ,  accelerations:  Present,  decelerations:  Absent UC:   regular, every 4 minutes SVE:   Dilation: 6 Effacement (%):  (swollen) Station: -2 Exam by:: Connie Greer CNM Cervix is 90% effaced from about 3:00-9:00, and then very thick anterior half of cervix.  Labs: Lab Results  Component Value Date   WBC 8.8 05/16/2012   HGB 12.2 05/16/2012   HCT 37.2 05/16/2012   MCV 87.1 05/16/2012   PLT 246 05/16/2012    Assessment / Plan: Arrest in active phase of labor  Labor: no cervical change over about 12 hours.  Fetal Wellbeing:  Category II Pain Control:  Epidural will have anesthesia redose epidural. Anticipated MOD:  Concern for lack of labor progress. Lengthy conversatio with patient about inadequate contractions. Pt is tired and wants c-section. Agreeable to trying to get adequate contractions and then re-evaluate. Discussed with Dr. Debroah Loop.   Connie Greer 05/18/2012, 11:15 AM

## 2012-05-18 NOTE — Progress Notes (Signed)
I have seen and examined this patient and I agree with the above. Cam Hai 12:34 AM 05/18/2012

## 2012-05-18 NOTE — Progress Notes (Signed)
Ivonne Andrew CNM also updated of increasing temp. Explained to pt possibly getting an infection. Will continue to monitor closely.

## 2012-05-18 NOTE — Progress Notes (Signed)
Dr Jean Rosenthal notified of request for another epidural re dose. Dr Jean Rosenthal already spoke with Ivonne Andrew CNM about possible need for c section. Will be over to re dose.

## 2012-05-18 NOTE — Progress Notes (Signed)
ANTIBIOTIC CONSULT NOTE - INITIAL  Pharmacy Consult for gentamicin Indication: maternal fever  No Known Allergies  Patient Measurements: Height: 5\' 2"  (157.5 cm) Weight: 161 lb (73.029 kg) IBW/kg (Calculated) : 50.1   Vital Signs: Temp: 100.1 F (37.8 C) (10/02 1256) Temp src: Axillary (10/02 1256) BP: 112/53 mmHg (10/02 1302) Pulse Rate: 115  (10/02 1302)  Labs:  Basename 05/16/12 2030  WBC 8.8  HGB 12.2  PLT 246  [facility] --  CREATININE --   SCr on 9/26 was 0.58  Microbiology: No results found for this or any previous visit (from the past 720 hour(s)).  Medical History: Past Medical History  Diagnosis Date  . Heart murmur   . Pregnancy induced hypertension   . Heart palpitations   . PSVT (paroxysmal supraventricular tachycardia)   . Preterm labor     Medications:  Ampicillin 2g IV x 1  Assessment: Pt is a 41 yo G2P1 being initiated on ampicillin and gentamicin for r/o chorio.   Goal of Therapy:  gentamicin peak 6-8 mcg/ml  gentamicin trough <1 mcg/ml  Plan:  1. Gentamicin 120mg  IV q8h, first dose now 2. Follow up SCr if continued post-partum  Connie Greer 05/18/2012,1:42 PM

## 2012-05-18 NOTE — Progress Notes (Signed)
Connie Greer [place] is a 41 y.o. G2P1001 at [redacted]w[redacted]d admitted for induction of labor due to Post dates.  Subjective: Comfortable w/epidural.  Objective: BP 108/59  Pulse 102  Temp 98.4 F (36.9 C) (Oral)  Resp 18  Ht 5\' 2"  (1.575 m)  Wt 161 lb (73.029 kg)  BMI 29.45 kg/m2  LMP 08/03/2011      FHT:  FHR: 135 bpm, variability: moderate,  accelerations:  Present,  decelerations:  Present variable UC:   regular, every 2-5 minutes SVE:   6.5/70/-1 Labs: Lab Results  Component Value Date   WBC 8.8 05/16/2012   HGB 12.2 05/16/2012   HCT 37.2 05/16/2012   MCV 87.1 05/16/2012   PLT 246 05/16/2012    Assessment / Plan: Protracted active phase  Labor: not progressing, cervical swelling, IUPC placed, exaggerated Sims. Fetal Wellbeing:  Category II Pain Control:  Epidural I/D:  n/a Anticipated MOD:  NSVD  Lawernce Pitts 05/18/2012, 6:25 AM

## 2012-05-18 NOTE — Progress Notes (Signed)
CRNA in room for epidural bolus.

## 2012-05-18 NOTE — Progress Notes (Signed)
Connie Greer [place] is a 41 y.o. G2P1001 at [redacted]w[redacted]d by LMP admitted for induction of labor due to Post dates. Due date 9/23.  Subjective: Tired, good pain control  Objective: BP 112/53  Pulse 115  Temp 100.1 F (37.8 C) (Axillary)  Resp 23  Ht 5\' 2"  (1.575 m)  Wt 161 lb (73.029 kg)  BMI 29.45 kg/m2  LMP 08/03/2011      FHT:  FHR: 140 bpm, variability: moderate,  accelerations:  Present,  decelerations:  Absent UC:   regular, every 3 minutes SVE:   Dilation: 8 Effacement (%):  (swollen) Station: -2 Exam by:: Dr Debroah Loop  Labs: Lab Results  Component Value Date   WBC 8.8 05/16/2012   HGB 12.2 05/16/2012   HCT 37.2 05/16/2012   MCV 87.1 05/16/2012   PLT 246 05/16/2012    Assessment / Plan: Induction of labor due to postterm,  progressing well on pitocin  Labor: labor progressing, prolonged active phase Preeclampsia:  no signs or symptoms of toxicity Fetal Wellbeing:  Category I Pain Control:  Epidural I/D:  Fever, c/w chorioamnionitis Start Amp/Gent Anticipated MOD:  NSVD  Connie Greer,Connie Greer 05/18/2012, 1:35 PM

## 2012-05-18 NOTE — Progress Notes (Signed)
Pt asking for c section. CNM explaining to pt FHR reassuring, cervix unchanged and very swollen, UCs inadequate. Vertex probably asynclitic. Will continue increasing Pit and changing position for now. Will recheck x2-3 hours, then possibly c section if still on change. Pt reassured.

## 2012-05-18 NOTE — Progress Notes (Signed)
Pt tearful and frustrated. Wants to speak to attending to ask for c section. Reassurance given. Will talk to CNM.

## 2012-05-19 ENCOUNTER — Encounter (HOSPITAL_COMMUNITY): Payer: Self-pay | Admitting: Anesthesiology

## 2012-05-19 ENCOUNTER — Encounter (HOSPITAL_COMMUNITY)
Admission: RE | Disposition: A | Discharge status: Home / Self Care | Payer: Self-pay | Source: Ambulatory Visit | Attending: Obstetrics & Gynecology

## 2012-05-19 ENCOUNTER — Encounter (HOSPITAL_COMMUNITY): Payer: Self-pay

## 2012-05-19 ENCOUNTER — Inpatient Hospital Stay (HOSPITAL_COMMUNITY): Payer: Medicaid Other | Admitting: Anesthesiology

## 2012-05-19 DIAGNOSIS — Z302 Encounter for sterilization: Secondary | ICD-10-CM

## 2012-05-19 HISTORY — PX: TUBAL LIGATION: SHX77

## 2012-05-19 SURGERY — LIGATION, FALLOPIAN TUBE, POSTPARTUM
Anesthesia: Epidural | Site: Abdomen | Laterality: Bilateral | Wound class: Clean Contaminated

## 2012-05-19 MED ORDER — MEPERIDINE HCL 25 MG/ML IJ SOLN: 6.2500 mg | INTRAMUSCULAR | Status: DC | PRN

## 2012-05-19 MED ORDER — KETOROLAC TROMETHAMINE 30 MG/ML IJ SOLN
INTRAMUSCULAR | Status: DC | PRN
  Administered 2012-05-19: 30 mg via INTRAVENOUS

## 2012-05-19 MED ORDER — SODIUM BICARBONATE 8.4 % IV SOLN
INTRAVENOUS | Status: AC
  Filled 2012-05-19: qty 50

## 2012-05-19 MED ORDER — DIPHENHYDRAMINE HCL 25 MG PO CAPS
25.0000 mg | ORAL_CAPSULE | Freq: Four times a day (QID) | ORAL | Status: DC | PRN
Start: 2012-05-19 — End: 2012-05-20

## 2012-05-19 MED ORDER — BUPIVACAINE HCL (PF) 0.5 % IJ SOLN
INTRAMUSCULAR | Status: AC
  Filled 2012-05-19: qty 30

## 2012-05-19 MED ORDER — DIBUCAINE 1 % RE OINT: 1.0000 "application " | TOPICAL_OINTMENT | RECTAL | Status: DC | PRN

## 2012-05-19 MED ORDER — ONDANSETRON HCL 4 MG/2ML IJ SOLN: 4.0000 mg | Freq: Three times a day (TID) | INTRAMUSCULAR | Status: DC | PRN

## 2012-05-19 MED ORDER — FENTANYL CITRATE 0.05 MG/ML IJ SOLN: 25.0000 ug | INTRAMUSCULAR | Status: DC | PRN

## 2012-05-19 MED ORDER — SCOPOLAMINE 1 MG/3DAYS TD PT72
1.0000 | MEDICATED_PATCH | Freq: Once | TRANSDERMAL | Status: DC
  Filled 2012-05-19: qty 1

## 2012-05-19 MED ORDER — NALBUPHINE SYRINGE 5 MG/0.5 ML
5.0000 mg | INJECTION | INTRAMUSCULAR | Status: DC | PRN
  Filled 2012-05-19: qty 1

## 2012-05-19 MED ORDER — FENTANYL CITRATE 0.05 MG/ML IJ SOLN
INTRAMUSCULAR | Status: AC
  Filled 2012-05-19: qty 5

## 2012-05-19 MED ORDER — ZOLPIDEM TARTRATE 5 MG PO TABS: 5.0000 mg | ORAL_TABLET | Freq: Every evening | ORAL | Status: DC | PRN

## 2012-05-19 MED ORDER — ONDANSETRON HCL 4 MG PO TABS: 4.0000 mg | ORAL_TABLET | ORAL | Status: DC | PRN

## 2012-05-19 MED ORDER — ONDANSETRON HCL 4 MG/2ML IJ SOLN
INTRAMUSCULAR | Status: DC | PRN
  Administered 2012-05-19: 4 mg via INTRAVENOUS

## 2012-05-19 MED ORDER — LIDOCAINE-EPINEPHRINE 2 %-1:100000 IJ SOLN
INTRAMUSCULAR | Status: DC | PRN
  Administered 2012-05-19: 5 mL via INTRADERMAL
  Administered 2012-05-19: 3 mL via INTRADERMAL
  Administered 2012-05-19: 7 mL via INTRADERMAL

## 2012-05-19 MED ORDER — WITCH HAZEL-GLYCERIN EX PADS: 1.0000 "application " | MEDICATED_PAD | CUTANEOUS | Status: DC | PRN

## 2012-05-19 MED ORDER — MIDAZOLAM HCL 2 MG/2ML IJ SOLN
INTRAMUSCULAR | Status: AC
  Filled 2012-05-19: qty 2

## 2012-05-19 MED ORDER — OXYCODONE-ACETAMINOPHEN 5-325 MG PO TABS
ORAL_TABLET | ORAL | Status: AC
  Administered 2012-05-19: 1 via ORAL
  Filled 2012-05-19: qty 1

## 2012-05-19 MED ORDER — SODIUM BICARBONATE 8.4 % IV SOLN
INTRAVENOUS | Status: DC | PRN
  Administered 2012-05-19: 13:00:00 via EPIDURAL

## 2012-05-19 MED ORDER — FENTANYL CITRATE 0.05 MG/ML IJ SOLN
INTRAMUSCULAR | Status: DC | PRN
  Administered 2012-05-19 (×2): 50 ug via INTRAVENOUS

## 2012-05-19 MED ORDER — DIPHENHYDRAMINE HCL 50 MG/ML IJ SOLN: 25.0000 mg | INTRAMUSCULAR | Status: DC | PRN

## 2012-05-19 MED ORDER — NALOXONE HCL 0.4 MG/ML IJ SOLN: 0.4000 mg | INTRAMUSCULAR | Status: DC | PRN

## 2012-05-19 MED ORDER — SODIUM CHLORIDE 0.9 % IV SOLN
1.0000 ug/kg/h | INTRAVENOUS | Status: DC | PRN
  Filled 2012-05-19: qty 2.5

## 2012-05-19 MED ORDER — SENNOSIDES-DOCUSATE SODIUM 8.6-50 MG PO TABS
2.0000 | ORAL_TABLET | Freq: Every day | ORAL | Status: DC
  Administered 2012-05-19: 2 via ORAL

## 2012-05-19 MED ORDER — LIDOCAINE-EPINEPHRINE (PF) 2 %-1:200000 IJ SOLN
INTRAMUSCULAR | Status: AC
  Filled 2012-05-19: qty 20

## 2012-05-19 MED ORDER — OXYCODONE-ACETAMINOPHEN 5-325 MG PO TABS: 1.0000 | ORAL_TABLET | ORAL | Status: DC | PRN

## 2012-05-19 MED ORDER — KETOROLAC TROMETHAMINE 30 MG/ML IJ SOLN: 30.0000 mg | Freq: Four times a day (QID) | INTRAMUSCULAR | Status: DC | PRN

## 2012-05-19 MED ORDER — TETANUS-DIPHTH-ACELL PERTUSSIS 5-2.5-18.5 LF-MCG/0.5 IM SUSP: 0.5000 mL | Freq: Once | INTRAMUSCULAR | Status: DC

## 2012-05-19 MED ORDER — LACTATED RINGERS IV SOLN
INTRAVENOUS | Status: DC | PRN
  Administered 2012-05-19: 13:00:00 via INTRAVENOUS

## 2012-05-19 MED ORDER — ACETAMINOPHEN 10 MG/ML IV SOLN
1000.0000 mg | Freq: Four times a day (QID) | INTRAVENOUS | Status: DC | PRN
  Filled 2012-05-19: qty 100

## 2012-05-19 MED ORDER — KETOROLAC TROMETHAMINE 30 MG/ML IJ SOLN
INTRAMUSCULAR | Status: AC
  Filled 2012-05-19: qty 1

## 2012-05-19 MED ORDER — MIDAZOLAM HCL 2 MG/2ML IJ SOLN: 0.5000 mg | Freq: Once | INTRAMUSCULAR | Status: AC | PRN

## 2012-05-19 MED ORDER — PRENATAL MULTIVITAMIN CH
1.0000 | ORAL_TABLET | Freq: Every day | ORAL | Status: DC
  Administered 2012-05-20: 1 via ORAL
  Filled 2012-05-19: qty 1

## 2012-05-19 MED ORDER — SODIUM CHLORIDE 0.9 % IJ SOLN: 3.0000 mL | INTRAMUSCULAR | Status: DC | PRN

## 2012-05-19 MED ORDER — PHENYLEPHRINE 40 MCG/ML (10ML) SYRINGE FOR IV PUSH (FOR BLOOD PRESSURE SUPPORT)
PREFILLED_SYRINGE | INTRAVENOUS | Status: AC
  Filled 2012-05-19: qty 5

## 2012-05-19 MED ORDER — BUPIVACAINE HCL (PF) 0.5 % IJ SOLN
INTRAMUSCULAR | Status: DC | PRN
  Administered 2012-05-19: 5 mL

## 2012-05-19 MED ORDER — BENZOCAINE-MENTHOL 20-0.5 % EX AERO: 1.0000 "application " | INHALATION_SPRAY | CUTANEOUS | Status: DC | PRN

## 2012-05-19 MED ORDER — ONDANSETRON HCL 4 MG/2ML IJ SOLN
INTRAMUSCULAR | Status: AC
  Filled 2012-05-19: qty 2

## 2012-05-19 MED ORDER — DIPHENHYDRAMINE HCL 25 MG PO CAPS: 25.0000 mg | ORAL_CAPSULE | ORAL | Status: DC | PRN

## 2012-05-19 MED ORDER — ONDANSETRON HCL 4 MG/2ML IJ SOLN: 4.0000 mg | INTRAMUSCULAR | Status: DC | PRN

## 2012-05-19 MED ORDER — PROMETHAZINE HCL 25 MG/ML IJ SOLN: 6.2500 mg | INTRAMUSCULAR | Status: DC | PRN

## 2012-05-19 MED ORDER — METOCLOPRAMIDE HCL 5 MG/ML IJ SOLN: 10.0000 mg | Freq: Three times a day (TID) | INTRAMUSCULAR | Status: DC | PRN

## 2012-05-19 MED ORDER — PHENYLEPHRINE HCL 10 MG/ML IJ SOLN
INTRAMUSCULAR | Status: DC | PRN
  Administered 2012-05-19: 40 ug via INTRAVENOUS

## 2012-05-19 MED ORDER — MIDAZOLAM HCL 5 MG/5ML IJ SOLN
INTRAMUSCULAR | Status: DC | PRN
  Administered 2012-05-19 (×2): 1 mg via INTRAVENOUS

## 2012-05-19 MED ORDER — FENTANYL 2.5 MCG/ML BUPIVACAINE 1/10 % EPIDURAL INFUSION (WH - ANES)
INTRAMUSCULAR | Status: DC | PRN
  Administered 2012-05-17: 14 mL/h via EPIDURAL

## 2012-05-19 MED ORDER — LIDOCAINE HCL (CARDIAC) 20 MG/ML IV SOLN
INTRAVENOUS | Status: AC
  Filled 2012-05-19: qty 5

## 2012-05-19 MED ORDER — SIMETHICONE 80 MG PO CHEW: 80.0000 mg | CHEWABLE_TABLET | ORAL | Status: DC | PRN

## 2012-05-19 MED ORDER — DIPHENHYDRAMINE HCL 50 MG/ML IJ SOLN: 12.5000 mg | INTRAMUSCULAR | Status: DC | PRN

## 2012-05-19 MED ORDER — LIDOCAINE HCL (PF) 1 % IJ SOLN
INTRAMUSCULAR | Status: DC | PRN
  Administered 2012-05-17 (×2): 4 mL

## 2012-05-19 MED ORDER — LANOLIN HYDROUS EX OINT: TOPICAL_OINTMENT | CUTANEOUS | Status: DC | PRN

## 2012-05-19 SURGICAL SUPPLY — 22 items
CHLORAPREP W/TINT 26ML (MISCELLANEOUS) ×2 IMPLANT
CLIP FILSHIE TUBAL LIGA STRL (Clip) ×2 IMPLANT
CLOTH BEACON ORANGE TIMEOUT ST (SAFETY) ×2 IMPLANT
DECANTER SPIKE VIAL GLASS SM (MISCELLANEOUS) ×2 IMPLANT
GLOVE BIO SURGEON STRL SZ 6.5 (GLOVE) ×4 IMPLANT
GLOVE BIOGEL PI IND STRL 7.0 (GLOVE) ×1 IMPLANT
GLOVE BIOGEL PI INDICATOR 7.0 (GLOVE) ×1
GLOVE SURG SS PI 7.0 STRL IVOR (GLOVE) ×8 IMPLANT
GOWN PREVENTION PLUS LG XLONG (DISPOSABLE) IMPLANT
GOWN STRL REIN XL XLG (GOWN DISPOSABLE) ×6 IMPLANT
NS IRRIG 1000ML POUR BTL (IV SOLUTION) ×2 IMPLANT
PACK ABDOMINAL MINOR (CUSTOM PROCEDURE TRAY) ×2 IMPLANT
PROTECTOR NERVE ULNAR (MISCELLANEOUS) ×2 IMPLANT
STRIP CLOSURE SKIN 1/4X3 (GAUZE/BANDAGES/DRESSINGS) ×2 IMPLANT
STRIP CLOSURE SKIN 1/4X4 (GAUZE/BANDAGES/DRESSINGS) ×2 IMPLANT
SUT CHROMIC 0 CT 1 (SUTURE) IMPLANT
SUT VIC AB 0 CT1 27 (SUTURE) ×1
SUT VIC AB 0 CT1 27XBRD ANBCTR (SUTURE) ×1 IMPLANT
SUT VICRYL 4-0 PS2 18IN ABS (SUTURE) ×2 IMPLANT
TOWEL OR 17X24 6PK STRL BLUE (TOWEL DISPOSABLE) ×4 IMPLANT
TRAY FOLEY CATH 14FR (SET/KITS/TRAYS/PACK) ×2 IMPLANT
WATER STERILE IRR 1000ML POUR (IV SOLUTION) ×2 IMPLANT

## 2012-05-19 NOTE — Transfer of Care (Signed)
Immediate Anesthesia Transfer of Care Note  Patient: Connie Greer [place]  Procedure(s) Performed: Procedure(s) (LRB) with comments: POST PARTUM TUBAL LIGATION (Bilateral)  Patient Location: PACU  Anesthesia Type: Epidural  Level of Consciousness: awake, alert  and oriented  Airway & Oxygen Therapy: Patient Spontanous Breathing  Post-op Assessment: Report given to PACU RN and Post -op Vital signs reviewed and stable  Post vital signs: Reviewed and stable  Complications: No apparent anesthesia complications

## 2012-05-19 NOTE — Progress Notes (Signed)
S. No problems. She wants to have a tubal ligation today despite having her newborn in the NICU. She and her husband understand the permanence of the procedure. They also understand the failure rate and the risk of an ectopic pregnancy  O. VSS, AF     Heart- rrr     Lungs- CTAB     Abd- benign  A/P. PP- doing well.      PPS today.

## 2012-05-19 NOTE — Brief Op Note (Signed)
05/16/2012 - 05/19/2012  2:07 PM  PATIENT:  Connie Greer  41 y.o. female  PRE-OPERATIVE DIAGNOSIS:  desire sterilization  POST-OPERATIVE DIAGNOSIS:  desire sterilization  PROCEDURE:  Procedure(s) (LRB) with comments: POST PARTUM TUBAL LIGATION (Bilateral)  SURGEON:  Surgeon(s) and Role:    * Allie Bossier, MD - Primary  PHYSICIAN ASSISTANT:   ASSISTANTS: none   ANESTHESIA:   epidural  EBL:  Total I/O In: 600 [I.V.:600] Out: 10 [Blood:10]  BLOOD ADMINISTERED:none  DRAINS: none   LOCAL MEDICATIONS USED:  MARCAINE     SPECIMEN:  No Specimen  DISPOSITION OF SPECIMEN:  N/A  COUNTS:  YES  TOURNIQUET:  * No tourniquets in log *  DICTATION: .Dragon Dictation  PLAN OF CARE: Admit to inpatient   PATIENT DISPOSITION:  PACU - hemodynamically stable.   Delay start of Pharmacological VTE agent (>24hrs) due to surgical blood loss or risk of bleeding: not applicable  The risks, benefits, and alternatives of surgery were explained, understood, and accepted. I discussed the failure rate of a tubal ligation of up to one in 200 people. She accepts all risks of surgery and wishes to proceed. Consents were signed, and all questions were answered. In the operating room her epidural was bolused for surgery after adequate anesthesia was assured her abdomen was prepped with ChloraPrep. 10 mL of 0.5% Marcaine were injected into the subcutaneous tissue at the umbilicus. A transverse him political incision was made the fascia was elevated with Coker clamps and fascial incision was made with Mayo scissors. The peritoneum was entered with hemostats. I used retractors to visualize the oviduct on the right. It was grasped with a Babcock clamp and traced to its fimbriated end. It was then retraced to the isthmic region where a post clip was placed across the entire tube. No bleeding was noted. The same procedure was done on the other side Both oviducts appeared normal. I injected about 1 mL of 0.5%  marcaine into each oviduct for post op pain relief.The fascia was then elevated with Coker clamps and closed with a 0 Vicryl running nonlocking suture. No defects were palpable. The subcuticular closure was done with 4-0 Vicryl suture excellent cosmetic results were obtained. She tolerated the procedure well. She was taken to recovery room in stable condition.

## 2012-05-19 NOTE — Anesthesia Preprocedure Evaluation (Signed)
Anesthesia Evaluation  Patient identified by MRN, date of birth, ID band Patient awake    Reviewed: Allergy & Precautions, H&P , Patient's Chart, lab work & pertinent test results  Airway Mallampati: III TM Distance: >3 FB Neck ROM: Full    Dental No notable dental hx. (+) Teeth Intact   Pulmonary  Pulmonary Nodule  breath sounds clear to auscultation  Pulmonary exam normal       Cardiovascular hypertension, + dysrhythmias Supra Ventricular Tachycardia + Valvular Problems/Murmurs Rhythm:Regular Rate:Normal     Neuro/Psych negative neurological ROS  negative psych ROS   GI/Hepatic negative GI ROS, Neg liver ROS,   Endo/Other  negative endocrine ROS  Renal/GU negative Renal ROS  negative genitourinary   Musculoskeletal negative musculoskeletal ROS (+)   Abdominal   Peds  Hematology negative hematology ROS (+)   Anesthesia Other Findings Heart murmur     Pregnancy induced hypertension        Heart palpitations     PSVT (paroxysmal supraventricular tachycardia)        Preterm labor    Reproductive/Obstetrics (+) Pregnancy                           Anesthesia Physical  Anesthesia Plan  ASA: III  Anesthesia Plan: Epidural   Post-op Pain Management:    Induction:   Airway Management Planned: Natural Airway  Additional Equipment:   Intra-op Plan:   Post-operative Plan:   Informed Consent: I have reviewed the patients History and Physical, chart, labs and discussed the procedure including the risks, benefits and alternatives for the proposed anesthesia with the patient or authorized representative who has indicated his/her understanding and acceptance.   Dental advisory given  Plan Discussed with: Anesthesiologist and Surgeon  Anesthesia Plan Comments:         Anesthesia Quick Evaluation

## 2012-05-19 NOTE — Anesthesia Postprocedure Evaluation (Signed)
Anesthesia Post Note  Patient: Connie Greer  Procedure(s) Performed: Procedure(s) (LRB): POST PARTUM TUBAL LIGATION (Bilateral)  Anesthesia type: Epidural  Patient location: PACU  Post pain: Pain level controlled  Post assessment: Post-op Vital signs reviewed  Last Vitals:  Filed Vitals:   05/19/12 1230  BP: 109/68  Pulse: 86  Temp: 36.4 C  Resp: 20    Post vital signs: Reviewed  Level of consciousness: awake  Complications: No apparent anesthesia complications

## 2012-05-19 NOTE — Anesthesia Procedure Notes (Signed)
Epidural Patient location during procedure: OB Start time: 05/17/2012 11:52 PM  Staffing Anesthesiologist: Shawnte Winton A. Performed by: anesthesiologist   Preanesthetic Checklist Completed: patient identified, site marked, surgical consent, pre-op evaluation, timeout performed, IV checked, risks and benefits discussed and monitors and equipment checked  Epidural Patient position: sitting Prep: site prepped and draped and DuraPrep Patient monitoring: continuous pulse ox and blood pressure Approach: midline Injection technique: LOR air  Needle:  Needle type: Tuohy  Needle gauge: 17 G Needle length: 9 cm and 9 Needle insertion depth: 6 cm Catheter type: closed end flexible Catheter size: 19 Gauge Catheter at skin depth: 11 cm Test dose: negative and Other  Assessment Events: blood not aspirated, injection not painful, no injection resistance, negative IV test and no paresthesia  Additional Notes Patient identified. Risks and benefits discussed including failed block, incomplete  Pain control, post dural puncture headache, nerve damage, paralysis, blood pressure Changes, nausea, vomiting, reactions to medications-both toxic and allergic and post Partum back pain. All questions were answered. Patient expressed understanding and wished to proceed. Sterile technique was used throughout procedure. Epidural site was Dressed with sterile barrier dressing. No paresthesias, signs of intravascular injection Or signs of intrathecal spread were encountered.  Patient was more comfortable after the epidural was dosed. Please see RN's note for documentation of vital signs and FHR which are stable.

## 2012-05-19 NOTE — Progress Notes (Signed)
UR Chart review completed.  

## 2012-05-20 ENCOUNTER — Encounter (HOSPITAL_COMMUNITY): Payer: Self-pay | Admitting: Obstetrics & Gynecology

## 2012-05-20 MED ORDER — IBUPROFEN 600 MG PO TABS: 600.0000 mg | ORAL_TABLET | Freq: Four times a day (QID) | ORAL | Status: DC | PRN

## 2012-05-20 MED ORDER — OXYCODONE-ACETAMINOPHEN 5-325 MG PO TABS: 1.0000 | ORAL_TABLET | ORAL | Status: DC | PRN

## 2012-05-20 NOTE — Discharge Summary (Signed)
Obstetric Discharge Summary Reason for Admission: induction of labor Prenatal Procedures: NST and ultrasound Intrapartum Procedures: spontaneous vaginal delivery and tubal ligation Postpartum Procedures: P.P. tubal ligation and Rho(D) Ig Complications-Operative and Postpartum: none and difficulty walking and reports feeling not returned in right leg. Anesthesia aware and told her yesterday that it may take 4-6 wks to fully recover function.  Baby doing well in NICU .  Hemoglobin  Date Value Range Status  05/16/2012 12.2  12.0 - 15.0 g/dL Final     HCT  Date Value Range Status  05/16/2012 37.2  36.0 - 46.0 % Final    Physical Exam:  General: alert, cooperative, appears stated age and mild distress Lochia: appropriate Uterine Fundus: firm Incision: healing well, some dry blood at infraumbilical incision site with steri strips in place DVT Evaluation: No evidence of DVT seen on physical exam.  Discharge Diagnoses: Term Pregnancy-delivered and Post-date pregnancy  Discharge Information: Date: 05/20/2012 Activity: per booklet Diet: routine Medications: PNV, Ibuprofen and Percocet Condition: stable Instructions: refer to practice specific booklet Discharge to: home   Newborn Data: Live born female  Birth Weight: 8 lb 15.2 oz (4060 g) APGAR: 1, 4  Home with NICU.  Connie Greer 05/20/2012, 11:36 AM

## 2012-05-20 NOTE — Progress Notes (Signed)
[facility] phoned to have second nurse at bedside for delivery.

## 2012-05-20 NOTE — Progress Notes (Signed)
NICU called for delivery due to decreased variability for 2-3 hours with repetitive decels and variables.

## 2012-05-20 NOTE — Anesthesia Postprocedure Evaluation (Signed)
  Anesthesia Post-op Note  Patient: Connie Greer  Procedure(s) Performed: * No procedures listed *  This patient has recovered from her labor epidural, and I am not aware of any complications or problems.

## 2012-05-20 NOTE — Discharge Summary (Signed)
Attestation of Attending Supervision of Advanced Practitioner (CNM/NP): Evaluation and management procedures were performed by the Advanced Practitioner under my supervision and collaboration.  I have reviewed the Advanced Practitioner's note and chart, and I agree with the management and plan.  HARRAWAY-SMITH, Adalid Beckmann 4:41 PM     

## 2012-05-20 NOTE — Progress Notes (Signed)
Pt. Is discharged in the care of husband Downstairs per wheelchair. Stable stated she is still having difficulty walking. Encourage to ambulate more.Denies any excessive vaginal bleeding.  Infant to remain in [facility].

## 2012-05-20 NOTE — H&P (Signed)
Attestation of Attending Supervision of Resident: Evaluation and management procedures were performed by the Family Medicine Resident under my supervision.  I have reviewed the resident's note and chart, and I agree with the management and plan.  UGONNA  ANYANWU, MD, FACOG Attending Obstetrician & Gynecologist Faculty Practice, [facility]  

## 2012-05-20 NOTE — Clinical Social Work Maternal (Signed)
Clinical Social Work Department PSYCHOSOCIAL ASSESSMENT - MATERNAL/CHILD 05/20/2012  Patient:  Connie Greer, Connie Greer  Account Number:  [id number]  Admit Date:  05/16/2012  Connie Greer Name:   Connie Greer    Clinical Social Worker:  Lulu Riding, [place]   Date/Time:  05/20/2012 11:30 AM  Date Referred:  05/20/2012   Referral source  NICU     Referred reason  NICU   Other referral source:    I:  FAMILY / HOME ENVIRONMENT Child's legal guardian:  PARENT  Guardian - Name Guardian - Age Guardian - Address  Connie Greer [address], [place], [place] [address]  Connie Greer  same   Other household support members/support persons Name Relationship DOB  Connie Greer     Other support:   Parents report having a great support system of family and friends.  FOB states that he is from [place] and his entire family is here.  He reports having a lot of friends. MOB and her family moved here from [place] 3 years ago.    II  PSYCHOSOCIAL DATA Information Source:  Family Interview  Event organiser Employment:   FOB is a Production designer, theatre/television/film at [employer] in [place] is a Engineer, materials for [employer] resources:  [employer] If Medicaid - [employer]:  [employer] Other  [employer]   School / Grade:   Maternity Care Coordinator / Child Services Coordination / Early Interventions:  Cultural issues impacting care:   None identified    III  STRENGTHS Strengths  Adequate Resources  Compliance with medical plan  Home prepared for Child (including basic supplies)  Other - See comment  Supportive family/friends  Understanding of illness   Strength comment:  Baby's pediatric followup [facility] at [facility].   IV  RISK FACTORS AND CURRENT PROBLEMS Current Problem:  None   Risk Factor & Current Problem Patient Issue Family Issue Risk Factor / Current Problem Comment   N N     V  SOCIAL WORK ASSESSMENT SW met with parents in MOB's third  floor room to introduce myself, complete assessment and evaluate how they are coping with baby's admission to NICU.  Parents were chatty and friendly and welcoming of SW's visit.  MOB states that she is in pain, but that we can talk now.  FOB was very attentive to her.  They appear to have a good understanding of the situation and report to be handling it well.  They seem supportive of each other.  They are both very excited about the baby.  They report no issues with transportation after MOB's discharge.  MOB states that she has been discharged today, but would like to be able to stay until her husband gets off work, which is at [employer].  SW informed Retail banker of this request.  Parents state that they have all needed baby supplies except for a breast pump and are not sure what is happening with this program due to the current status of the government.  SW suggests that they discuss pump rental/possible loaner with [facility].  RN aware of this also.  SW explained support services offered by NICU SW and discussed signs and symptoms of PPD.  MOB states she feels comfortable calling her doctor if symptoms arise.  SW informed her that she may call SW if symptoms arise also. She states she has been crying more from pain at this point and feels like she is doing well emotionally.  She denies  any history of symptoms.  SW gave Feelings After Birth handout as a reference and gave SW contact infromation.      VI SOCIAL WORK PLAN Social Work Plan  Psychosocial Support/Ongoing Assessment of Needs   Type of pt/family education:   PPD signs and symptoms   If child protective services report - county:   If child protective services report - date:   Information/referral to community resources comment:   Feelings After Birth support if needed   Other social work plan:

## 2012-05-21 ENCOUNTER — Inpatient Hospital Stay (HOSPITAL_COMMUNITY)
Admission: AD | Admit: 2012-05-21 | Discharge: 2012-05-21 | Disposition: A | Discharge status: Home / Self Care | Payer: Medicaid Other | Source: Ambulatory Visit | Attending: Obstetrics & Gynecology | Admitting: Obstetrics & Gynecology

## 2012-05-21 ENCOUNTER — Encounter (HOSPITAL_COMMUNITY): Payer: Self-pay

## 2012-05-21 DIAGNOSIS — IMO0002 Reserved for concepts with insufficient information to code with codable children: Secondary | ICD-10-CM | POA: Insufficient documentation

## 2012-05-21 DIAGNOSIS — M549 Dorsalgia, unspecified: Secondary | ICD-10-CM

## 2012-05-21 DIAGNOSIS — N393 Stress incontinence (female) (male): Secondary | ICD-10-CM | POA: Insufficient documentation

## 2012-05-21 DIAGNOSIS — M545 Low back pain, unspecified: Secondary | ICD-10-CM | POA: Insufficient documentation

## 2012-05-21 DIAGNOSIS — O99893 Other specified diseases and conditions complicating puerperium: Secondary | ICD-10-CM | POA: Insufficient documentation

## 2012-05-21 HISTORY — DX: Unspecified convulsions: R56.9

## 2012-05-21 LAB — URINE MICROSCOPIC-ADD ON

## 2012-05-21 LAB — URINALYSIS, ROUTINE W REFLEX MICROSCOPIC
Bilirubin Urine: NEGATIVE
Ketones, ur: NEGATIVE mg/dL
Nitrite: NEGATIVE
Urobilinogen, UA: 0.2 mg/dL (ref 0.0–1.0)
pH: 6 (ref 5.0–8.0)

## 2012-05-21 MED ORDER — DOCUSATE SODIUM 100 MG PO CAPS: 100.0000 mg | ORAL_CAPSULE | Freq: Two times a day (BID) | ORAL | Status: DC

## 2012-05-21 MED ORDER — CEPHALEXIN 500 MG PO CAPS: 500.0000 mg | ORAL_CAPSULE | Freq: Three times a day (TID) | ORAL | Status: DC

## 2012-05-21 MED ORDER — CYCLOBENZAPRINE HCL 10 MG PO TABS: 10.0000 mg | ORAL_TABLET | Freq: Three times a day (TID) | ORAL | Status: DC | PRN

## 2012-05-21 MED ORDER — OXYCODONE-ACETAMINOPHEN 5-325 MG PO TABS
1.0000 | ORAL_TABLET | Freq: Once | ORAL | Status: AC
  Administered 2012-05-21: 1 via ORAL
  Filled 2012-05-21: qty 1

## 2012-05-21 MED ORDER — POLYETHYLENE GLYCOL 3350 17 G PO PACK: 17.0000 g | PACK | Freq: Every day | ORAL | Status: DC

## 2012-05-21 NOTE — MAU Provider Note (Signed)
Attestation of Attending Supervision of Advanced Practitioner (CNM/NP): Evaluation and management procedures were performed by the Advanced Practitioner under my supervision and collaboration.  I have reviewed the Advanced Practitioner's note and chart, and I agree with the management and plan.  HARRAWAY-SMITH, Rica Heather 8:08 PM

## 2012-05-21 NOTE — MAU Provider Note (Signed)
History     CSN: [id number]  Arrival date and time: 05/21/12 1642   None     Chief Complaint  Patient presents with  . Leg Swelling  . Urinary Incontinence  . Back Pain   HPI 41 y.o. G2P2002 3 days s/p NSVD and BTL with multiple complaints.  1 - Urinary incontinence. States she cannot hold her urine, feels bladder getting full when laying in bed, then leaks large amount of urine upon standing up. Was able to stop stream of urine today, only leaks urine upon standing when bladder is full.  2 - Low back/coccyx pain - since delivery, feels like a "pinched nerve", makes it difficult to move legs, hurts when sitting, better with motrin/percocet 3 - Lower extremity swelling - states feet are swollen and painful bilaterally 4 - Constipation - no BM in 2 days, afraid to push because of stitches  Baby is in NICU - patient states that she feels much better when she is here with baby  Past Medical History  Diagnosis Date  . Heart murmur   . Pregnancy induced hypertension   . Heart palpitations   . PSVT (paroxysmal supraventricular tachycardia)   . Preterm labor   . Seizures 1994    No current hx; during the delivery of first child    Past Surgical History  Procedure Date  . Tonsillectomy   . Tubal ligation 05/19/2012    Procedure: POST PARTUM TUBAL LIGATION;  Surgeon: Allie Bossier, MD;  Location: [facility];  Service: Gynecology;  Laterality: Bilateral;    Family History  Problem Relation Age of Onset  . Hypertension Mother   . Cancer Mother     Cervical  . Hypertension Father   . Anesthesia problems Neg Hx     History  Substance Use Topics  . Smoking status: Former Smoker -- 0.2 packs/day for 10 years    Quit date: 08/31/2011  . Smokeless tobacco: Never Used  . Alcohol Use: No    Allergies: No Known Allergies  Prescriptions prior to admission  Medication Sig Dispense Refill  . acetaminophen (TYLENOL) 500 MG tablet Take 500 mg by mouth every 6 (six) hours as needed. For  pain      . ibuprofen (ADVIL,MOTRIN) 600 MG tablet Take 1 tablet (600 mg total) by mouth every 6 (six) hours as needed for pain.  30 tablet  1  . oxyCODONE-acetaminophen (PERCOCET/ROXICET) 5-325 MG per tablet Take 1 tablet by mouth every 4 (four) hours as needed for pain.  20 tablet  0  . Prenatal Vit-Fe Fumarate-FA (PRENATAL MULTIVITAMIN) TABS Take 1 tablet by mouth daily.        Review of Systems  Constitutional: Negative.   HENT: Negative.   Eyes: Negative.   Respiratory: Negative for shortness of breath.   Cardiovascular: Negative for chest pain.  Gastrointestinal: Positive for constipation. Negative for nausea, vomiting and abdominal pain.  Genitourinary: Positive for urgency and frequency. Negative for dysuria.       + urinary incontinence  Musculoskeletal: Positive for back pain.  Neurological: Negative.   Psychiatric/Behavioral: Negative.    Physical Exam   Blood pressure 131/69, pulse 96, temperature 98.1 F (36.7 C), temperature source Oral, resp. rate 20, last menstrual period 08/03/2011, unknown if currently breastfeeding.  Physical Exam  Nursing note and vitals reviewed. Constitutional: She is oriented to person, place, and time. She appears well-developed and well-nourished. She appears distressed (uncomfortable/anxious appearing).  Cardiovascular: Normal rate.   Respiratory: Effort normal.  Musculoskeletal: Normal  range of motion. She exhibits edema (2+ bilateral pedal ) and tenderness (low back).       Normal/equal strength and sensation in bilateral lower extremities  Neurological: She is alert and oriented to person, place, and time. She has normal reflexes. She exhibits normal muscle tone. Coordination normal.  Skin: Skin is warm and dry.  Psychiatric: Her mood appears anxious.    MAU Course  Procedures  Results for orders placed during the hospital encounter of 05/21/12 (from the past 24 hour(s))  URINALYSIS, ROUTINE W REFLEX MICROSCOPIC     Status:  Abnormal   Collection Time   05/21/12  6:12 PM      Component Value Range   Color, Urine STRAW (*) YELLOW   APPearance CLOUDY (*) CLEAR   Specific Gravity, Urine 1.015  1.005 - 1.030   pH 6.0  5.0 - 8.0   Glucose, UA NEGATIVE  NEGATIVE mg/dL   Hgb urine dipstick LARGE (*) NEGATIVE   Bilirubin Urine NEGATIVE  NEGATIVE   Ketones, ur NEGATIVE  NEGATIVE mg/dL   Protein, ur NEGATIVE  NEGATIVE mg/dL   Urobilinogen, UA 0.2  0.0 - 1.0 mg/dL   Nitrite NEGATIVE  NEGATIVE   Leukocytes, UA MODERATE (*) NEGATIVE  URINE MICROSCOPIC-ADD ON     Status: Abnormal   Collection Time   05/21/12  6:12 PM      Component Value Range   Squamous Epithelial / LPF FEW (*) RARE   WBC, UA 21-50  <3 WBC/hpf   RBC / HPF 21-50  <3 RBC/hpf   Bacteria, UA FEW (*) RARE     Assessment and Plan   Postpartum stress urinary incontinence, low back pain, and LE edema Rev'd normal postpartum changes vs. Warning signs Motrin and Percocet as prescribed for pain, Flexeril rx given Rev'd kegel exercises Increase fluid, elevate feet, increase ambulation - report if swelling not improved past 2 weeks postpartum Recommended Colace/Miralax as needed for constipation F/U as scheduled   Gifford Ballon 05/21/2012, 7:14 PM

## 2012-05-21 NOTE — MAU Note (Signed)
Patient had vaginal birth on Wednesday 10/2, baby in NICU, patient was [redacted] weeks gestation upon delivery, c/o lower extremity edema, pain from pinched nerve and urinary incontinence, patient was brought directly to room 2 from lobby via wheelchair.

## 2012-05-21 NOTE — MAU Note (Signed)
"  I had my baby on Wednesday.  I have been having swelling and I can't hold my pee.  When I stand up, it just cones down by itself.  No dizziness or blurry vision.  My baby is in NICU, because she drank some amniotic fluid and I had a fever.  My vaginal bleeding is fine."

## 2012-05-28 LAB — RH IG WORKUP (INCLUDES ABO/RH)

## 2012-06-27 ENCOUNTER — Encounter: Payer: Self-pay | Admitting: Family Medicine

## 2012-06-27 ENCOUNTER — Ambulatory Visit: Payer: Medicaid Other | Admitting: Family Medicine

## 2012-06-27 NOTE — Patient Instructions (Signed)
Place postpartum visit patient instructions here.  Postpartum Depression and Baby Blues The postpartum period begins right after the birth of a baby. During this time, there is often a great amount of joy and excitement. It is also a time of considerable changes in the life of the parent(s). Regardless of how many times a mother gives birth, each child brings new challenges and dynamics to the family. It is not unusual to have feelings of excitement accompanied by confusing shifts in moods, emotions, and thoughts. All mothers are at risk of developing postpartum depression or the "baby blues." These mood changes can occur right after giving birth, or they may occur many months after giving birth. The baby blues or postpartum depression can be mild or severe. Additionally, postpartum depression can resolve rather quickly, or it can be a long-term condition. CAUSES Elevated hormones and their rapid decline are thought to be a main cause of postpartum depression and the baby blues. There are a number of hormones that radically change during and after pregnancy. Estrogen and progesterone usually decrease immediately after delivering your baby. The level of thyroid hormone and various cortisol steroids also rapidly drop. Other factors that play a major role in these changes include major life events and genetics.  RISK FACTORS If you have any of the following risks for the baby blues or postpartum depression, know what symptoms to watch out for during the postpartum period. Risk factors that may increase the likelihood of getting the baby blues or postpartum depression include:  Havinga personal or family history of depression.  Having depression while being pregnant.  Having premenstrual or oral contraceptive-associated mood issues.  Having exceptional life stress.  Having marital conflict.  Lacking a social support network.  Having a baby with special needs.  Having health problems such as  diabetes. SYMPTOMS Baby blues symptoms include:  Brief fluctuations in mood, such as going from extreme happiness to sadness.  Decreased concentration.  Difficulty sleeping.  Crying spells, tearfulness.  Irritability.  Anxiety. Postpartum depression symptoms typically begin within the first month after giving birth. These symptoms include:  Difficulty sleeping or excessive sleepiness.  Marked weight loss.  Agitation.  Feelings of worthlessness.  Lack of interest in activity or food. Postpartum psychosis is a very concerning condition and can be dangerous. Fortunately, it is rare. Displaying any of the following symptoms is cause for immediate medical attention. Postpartum psychosis symptoms include:  Hallucinations and delusions.  Bizarre or disorganized behavior.  Confusion or disorientation. DIAGNOSIS  A diagnosis is made by an evaluation of your symptoms. There are no medical or lab tests that lead to a diagnosis, but there are various questionnaires that a caregiver may use to identify those with the baby blues, postpartum depression, or psychosis. Often times, a screening tool called the [place] Postnatal Depression Scale is used to diagnose depression in the postpartum period.  TREATMENT The baby blues usually goes away on its own in 1 to 2 weeks. Social support is often all that is needed. You should be encouraged to get adequate sleep and rest. Occasionally, you may be given medicines to help you sleep.  Postpartum depression requires treatment as it can last several months or longer if it is not treated. Treatment may include individual or group therapy, medicine, or both to address any social, physiological, and psychological factors that may play a role in the depression. Regular exercise, a healthy diet, rest, and social support may also be strongly recommended.  Postpartum psychosis is more serious  and needs treatment right away. Hospitalization is often  needed. HOME CARE INSTRUCTIONS  Get as much rest as you can. Nap when the baby sleeps.  Exercise regularly. Some women find yoga and walking to be beneficial.  Eat a balanced and nourishing diet.  Do little things that you enjoy. Have a cup of tea, take a bubble bath, read your favorite magazine, or listen to your favorite music.  Avoid alcohol.  Ask for help with household chores, cooking, grocery shopping, or running errands as needed. Do not try to do everything.  Talk to people close to you about how you are feeling. Get support from your partner, family members, friends, or other new moms.  Try to stay positive in how you think. Think about the things you are grateful for.  Do not spend a lot of time alone.  Only take medicine as directed by your caregiver.  Keep all your postpartum appointments.  Let your caregiver know if you have any concerns. SEEK MEDICAL CARE IF: You are having a reaction or problems with your medicine. SEEK IMMEDIATE MEDICAL CARE IF:  You have suicidal feelings.  You feel you may harm the baby or someone else. Document Released: 05/07/2004 Document Revised: 10/26/2011 Document Reviewed: 06/09/2011 [facility] Patient Information 2013 [place], [place].

## 2012-06-27 NOTE — Progress Notes (Unsigned)
  Subjective:     Connie Greer is a 41 y.o. female who presents for a postpartum visit. She is {1-10:13787} {time; units:18646} postpartum following a {delivery:12449}. I have fully reviewed the prenatal and intrapartum course. The delivery was at *** gestational weeks. Outcome: {delivery outcome:32078}. Anesthesia: {anesthesia types:812}. Postpartum course has been ***. Baby's course has been ***. Baby is feeding by {breast/bottle:69}. Bleeding {vag bleed:12292}. Bowel function is {normal:32111}. Bladder function is {normal:32111}. Patient {is/is not:9024} sexually active. Contraception method is {contraceptive method:5051}. Postpartum depression screening: {neg default:13464::"negative"}.  {Common ambulatory SmartLinks:19316}  Review of Systems {ros; complete:30496}   Objective:    BP 115/72  Pulse 81  Ht 5\' 2"  (1.575 m)  Wt 132 lb (59.875 kg)  BMI 24.14 kg/m2  LMP 06/19/2012  Breastfeeding? No  General:  {gen appearance:16600}   Breasts:  {breast exam:1202::"inspection negative, no nipple discharge or bleeding, no masses or nodularity palpable"}  Lungs: {lung exam:16931}  Heart:  {heart exam:5510}  Abdomen: {abdomen exam:16834}   Vulva:  {labia exam:12198}  Vagina: {vagina exam:12200}  Cervix:  {cervix exam:14595}  Corpus: {uterus exam:12215}  Adnexa:  {adnexa exam:12223}  Rectal Exam: {rectal/vaginal exam:12274}        Assessment:    *** postpartum exam. Pap smear {done:10129} at today's visit.   Plan:    1. Contraception: {method:5051} 2. *** 3. Follow up in: {1-10:13787} {time; units:19136} or as needed.

## 2012-06-28 ENCOUNTER — Ambulatory Visit: Payer: Medicaid Other | Admitting: Obstetrics & Gynecology

## 2012-07-11 ENCOUNTER — Ambulatory Visit: Payer: Medicaid Other | Admitting: Family Medicine

## 2012-08-01 ENCOUNTER — Ambulatory Visit (INDEPENDENT_AMBULATORY_CARE_PROVIDER_SITE_OTHER): Payer: Medicaid Other | Admitting: Obstetrics & Gynecology

## 2012-08-01 ENCOUNTER — Encounter: Payer: Self-pay | Admitting: Obstetrics & Gynecology

## 2012-08-01 VITALS — BP 132/80 | HR 91 | Ht 63.0 in | Wt 132.0 lb

## 2012-08-01 DIAGNOSIS — N63 Unspecified lump in unspecified breast: Secondary | ICD-10-CM

## 2012-08-01 NOTE — Progress Notes (Signed)
  Subjective:    Patient ID: Connie Greer, female    DOB: September 24, 1970, 41 y.o.   MRN: [id number]  HPI  Connie Greer is 8 weeks pp/po s/p PPS who is here because of a right breast lump. She breast fed for about 3 1/2 weeks but stopped because she said, "Nothing was coming out". She is also concerned about the appearance of her abdomen.  Review of Systems     Objective:   Physical Exam (2) 3 cm breast masses on each breast, presumably from her recent breast feeding.  Noticeable rectus diasthesis       Assessment & Plan:   Rectus diasthesis- I discussed this with her and recommended a consult with a general surgeon. I answered her question about whether her PPS caused this with a resounding "No".  Breast lumps- I have recommended a sports bra day and night and a re check in 2 months.

## 2012-08-02 ENCOUNTER — Ambulatory Visit: Payer: Medicaid Other | Admitting: Family Medicine

## 2013-02-02 ENCOUNTER — Emergency Department: Payer: Self-pay | Admitting: Emergency Medicine

## 2013-11-09 IMAGING — US US OB COMP LESS 14 WK
1 series · 14 of 28 positions shown · non-contrast
Comparison: 10/14/2011.

CLINICAL DATA: Vaginal bleeding.  12 weeks 1 day pregnant by
previous ultrasound.

OBSTETRIC <14 WK ULTRASOUND
TECHNIQUE: Transabdominal ultrasound was performed for evaluation
of the gestation as well as the maternal uterus and adnexal
regions.

[Series 1: us ob transvaginal · 14 of 31 slices shown]
[im 2/31]
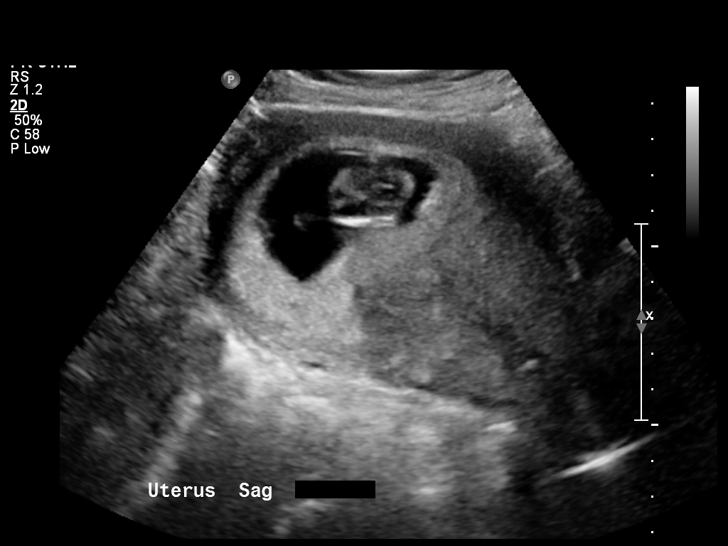
[im 4/31]
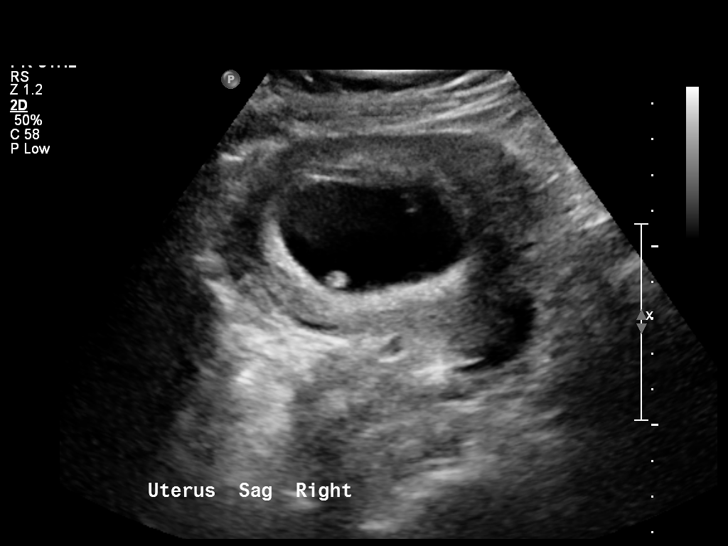
[im 6/31]
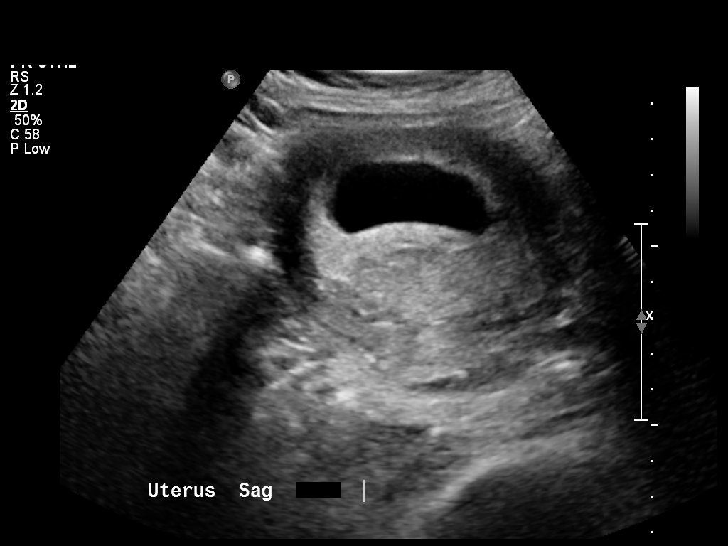
[im 8/31]
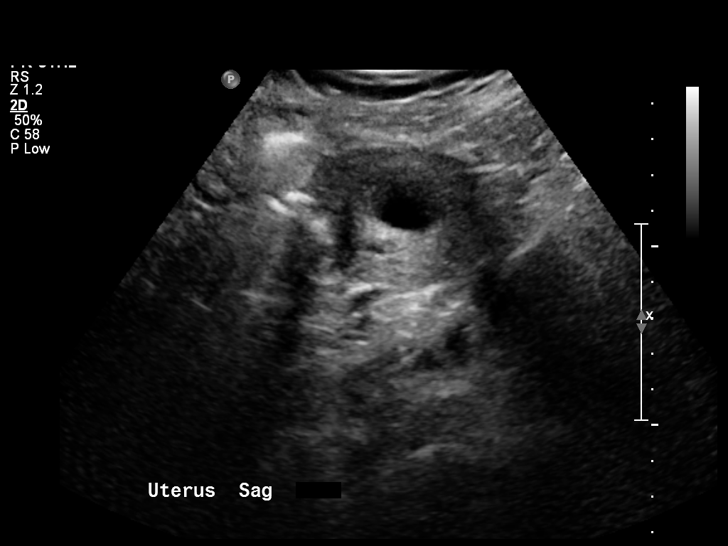
[im 11/31]
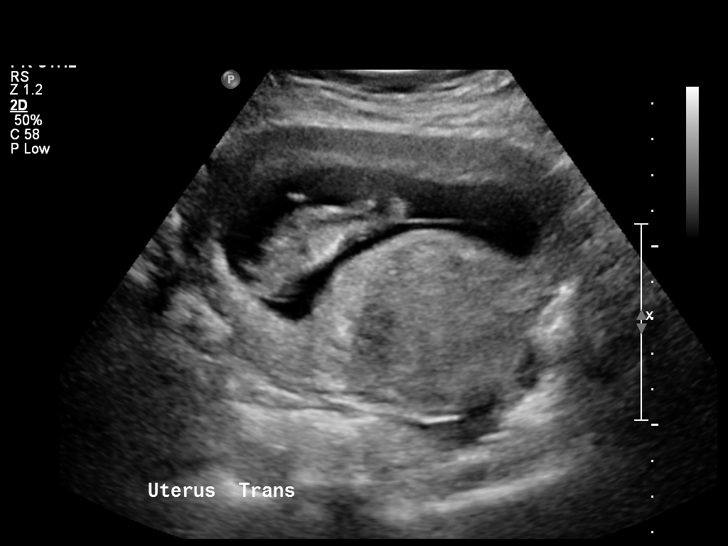
[im 13/31]
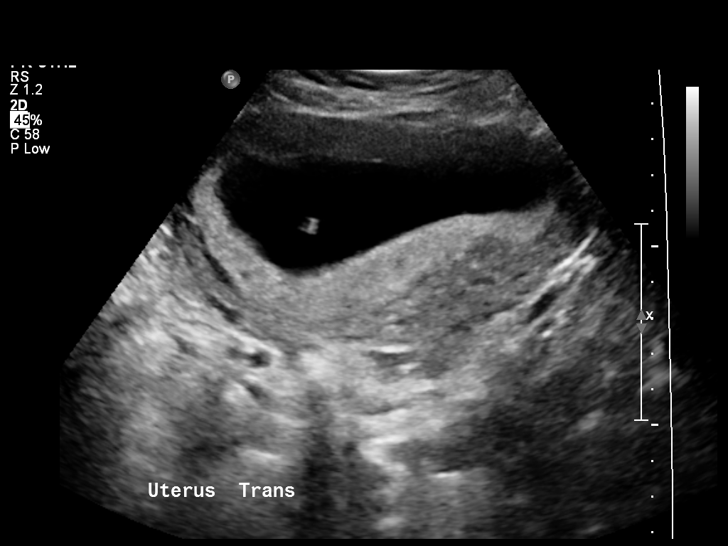
[im 15/31]
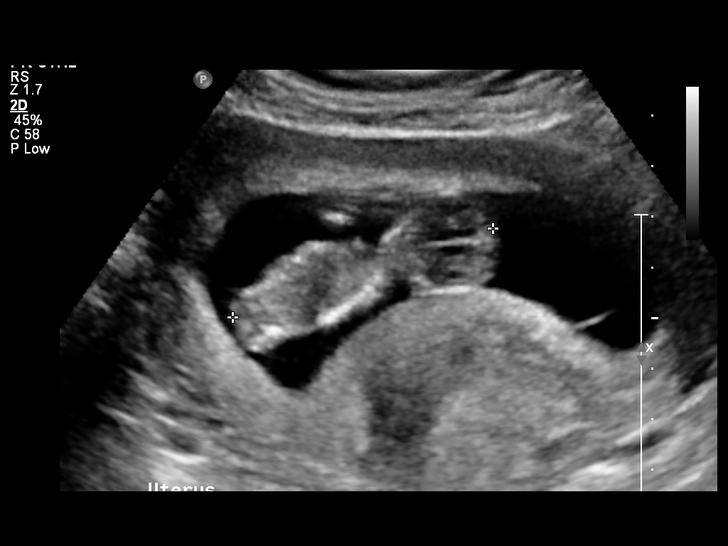
[im 17/31]
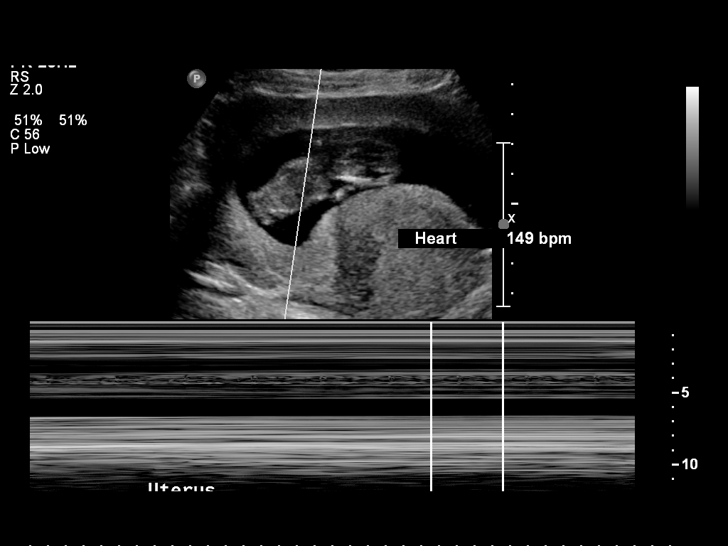
[im 19/31]
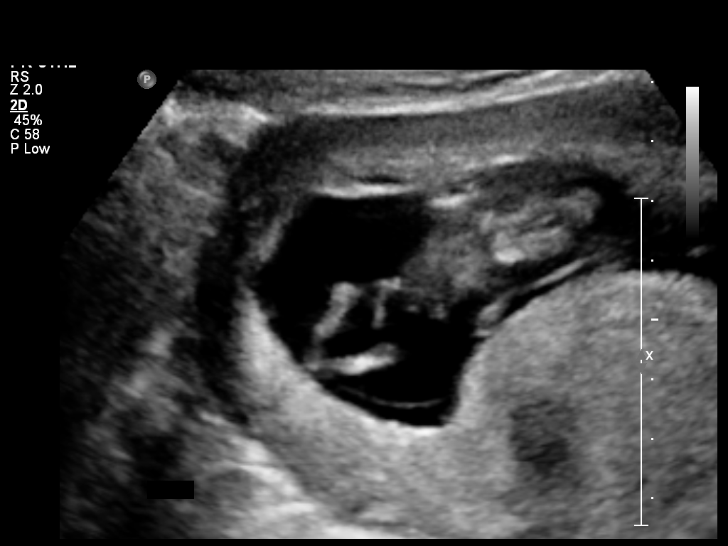
[im 22/31]
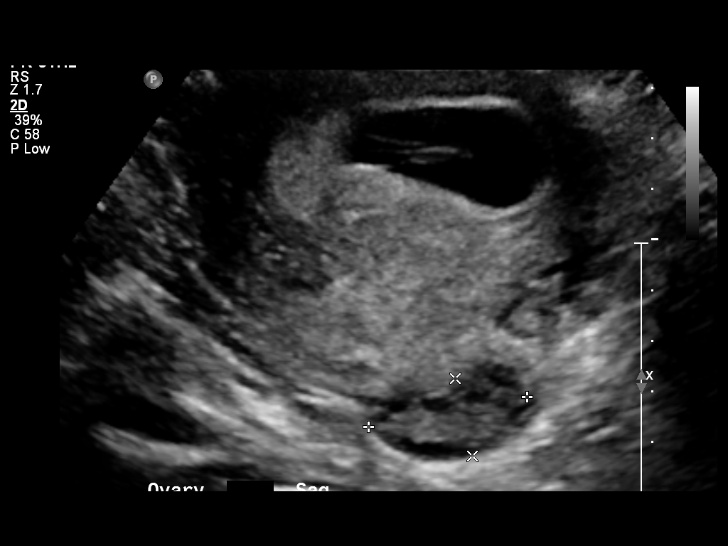
[im 24/31]
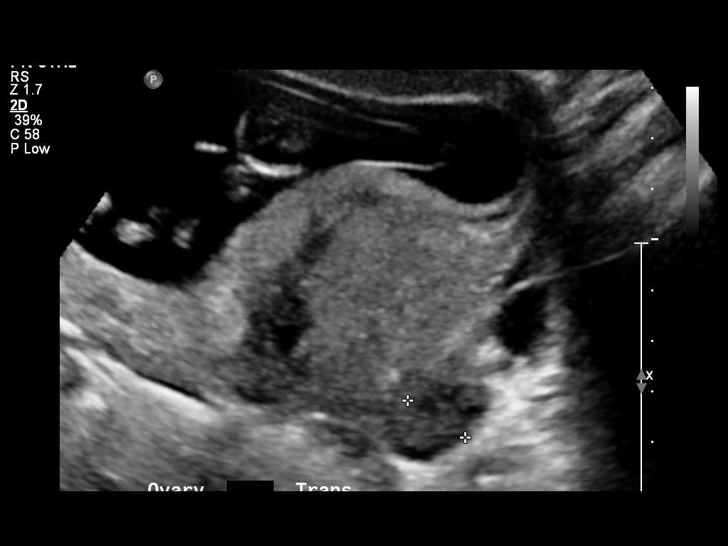
[im 26/31]
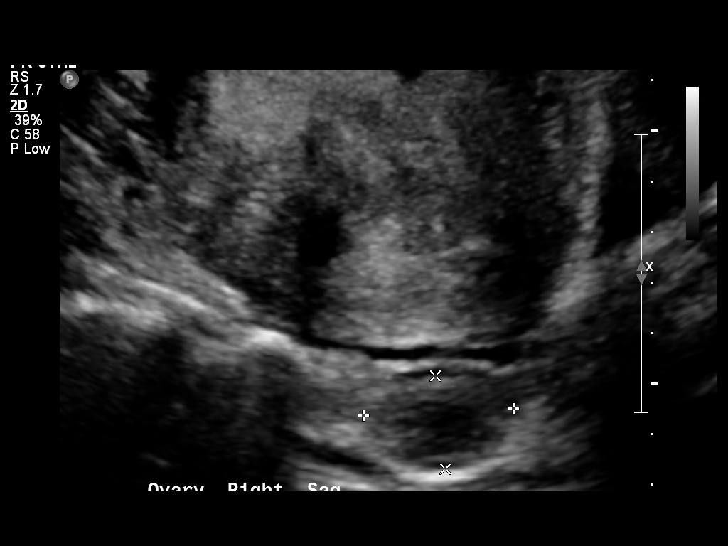
[im 28/31]
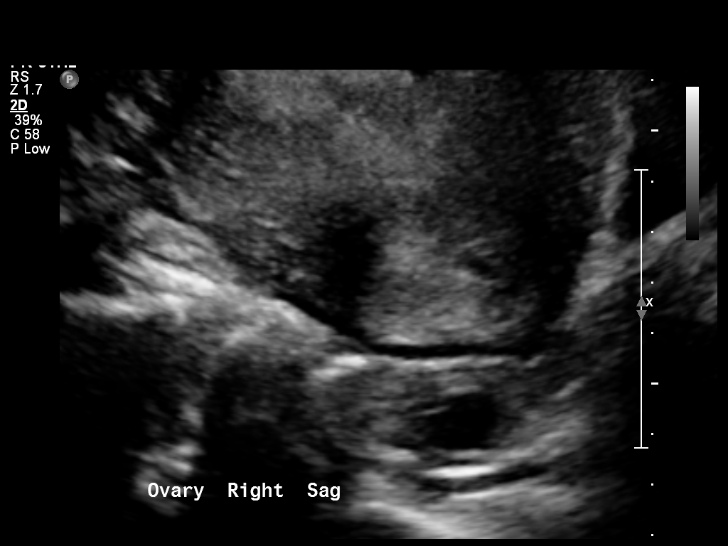
[im 31/31]
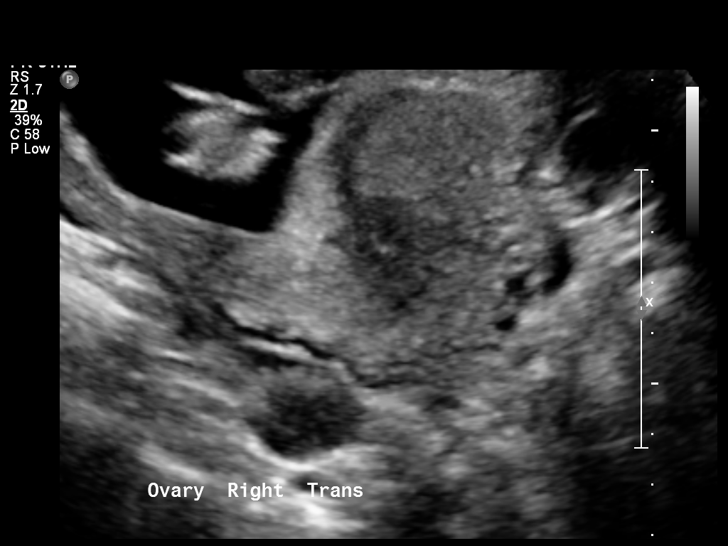

[14 of 28 positions shown; findings below may reference images not displayed]

Intrauterine gestational sac: Visualized/normal in shape.
Yolk sac: Visualized (small)
Embryo: Visualized
Cardiac Activity: Visualized
Heart Rate: 149 bpm
CRL:  54.2 mm  12w  1d        US EDC: 05/06/2012

Maternal uterus/Adnexae:
Right ovarian corpus luteum cyst.  Normal appearing left ovary.  No
subchorionic hemorrhage is seen at this time.  No free peritoneal
fluid.
IMPRESSION: Single live intrauterine gestation with estimated gestational age
of 12 weeks 1 day.  This represents normal growth since the
previous examination.  No complicating features.

## 2013-12-31 ENCOUNTER — Emergency Department: Payer: Self-pay | Admitting: Emergency Medicine

## 2014-01-03 ENCOUNTER — Ambulatory Visit: Payer: Self-pay

## 2014-06-18 ENCOUNTER — Encounter: Payer: Self-pay | Admitting: Obstetrics & Gynecology

## 2014-12-18 ENCOUNTER — Emergency Department
Admission: EM | Admit: 2014-12-18 | Discharge: 2014-12-18 | Disposition: A | Discharge status: Home / Self Care | Payer: Self-pay | Attending: Emergency Medicine | Admitting: Emergency Medicine

## 2014-12-18 ENCOUNTER — Emergency Department: Payer: Medicaid Other

## 2014-12-18 ENCOUNTER — Encounter: Payer: Self-pay | Admitting: Emergency Medicine

## 2014-12-18 ENCOUNTER — Encounter (INDEPENDENT_AMBULATORY_CARE_PROVIDER_SITE_OTHER): Payer: Self-pay

## 2014-12-18 DIAGNOSIS — Z79899 Other long term (current) drug therapy: Secondary | ICD-10-CM | POA: Insufficient documentation

## 2014-12-18 DIAGNOSIS — Y93E5 Activity, floor mopping and cleaning: Secondary | ICD-10-CM | POA: Insufficient documentation

## 2014-12-18 DIAGNOSIS — Y9259 Other trade areas as the place of occurrence of the external cause: Secondary | ICD-10-CM | POA: Insufficient documentation

## 2014-12-18 DIAGNOSIS — S6392XA Sprain of unspecified part of left wrist and hand, initial encounter: Secondary | ICD-10-CM | POA: Insufficient documentation

## 2014-12-18 DIAGNOSIS — S62309A Unspecified fracture of unspecified metacarpal bone, initial encounter for closed fracture: Secondary | ICD-10-CM

## 2014-12-18 DIAGNOSIS — Z87891 Personal history of nicotine dependence: Secondary | ICD-10-CM | POA: Insufficient documentation

## 2014-12-18 DIAGNOSIS — X58XXXA Exposure to other specified factors, initial encounter: Secondary | ICD-10-CM | POA: Insufficient documentation

## 2014-12-18 DIAGNOSIS — Y990 Civilian activity done for income or pay: Secondary | ICD-10-CM | POA: Insufficient documentation

## 2014-12-18 DIAGNOSIS — S62325B Displaced fracture of shaft of fourth metacarpal bone, left hand, initial encounter for open fracture: Secondary | ICD-10-CM | POA: Insufficient documentation

## 2014-12-18 NOTE — ED Notes (Signed)
Pt here with swelling and deformity to the right hand.  Pt states that she fell last week and had a knot. Pt states that she picked up a box yesterday and felt it pop. Pt does not have a knuckle at this time. Pt also has swelling to the finger area as well as redness. Pt states that she is having a lot.

## 2014-12-18 NOTE — ED Notes (Signed)
Pt reports that she was cleaning a ceiling fan a month and half ago, landing on her hand, the pain went away. 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 When she makes a fist her knuckle disappears.

## 2014-12-18 NOTE — ED Provider Notes (Signed)
[facility] Emergency Department Provider Note  ____________________________________________  Time seen: 1125  I have reviewed the triage vital signs and the nursing notes.   HISTORY  Chief Complaint Finger Injury  HPI [place] is a 44 y.o. female right-hand-dominant female with acute injury to the hand today while at work.She describes the fingers on the left hand being hyperextended as they slipped while she was picking up a box.She notes swelling to the dorsal left hand with the third knuckle. Additionally, she notes a fall to the left hand from a stepstool a month ago, for which she never sought care. She is concerned today for a decreased knuckle prominence on the left hand when she forms a composite fist.   Past Medical History  Diagnosis Date  . Heart murmur   . Pregnancy induced hypertension   . Heart palpitations   . PSVT (paroxysmal supraventricular tachycardia)   . Preterm labor   . Seizures 1994    No current hx; during the delivery of first child    Patient Active Problem List   Diagnosis Date Noted  . PSVT (paroxysmal supraventricular tachycardia)   . Pulmonary nodule 11/24/2011  . Heart palpitations-SVT     Past Surgical History  Procedure Laterality Date  . Tonsillectomy    . Tubal ligation  05/19/2012    Procedure: POST PARTUM TUBAL LIGATION;  Surgeon: Allie BossierMyra C Dove, MD;  Location: [facility];  Service: Gynecology;  Laterality: Bilateral;    Current Outpatient Rx  Name  Route  Sig  Dispense  Refill  . Prenatal Vit-Fe Fumarate-FA (PRENATAL MULTIVITAMIN) TABS   Oral   Take 1 tablet by mouth daily.           Allergies Review of patient's allergies indicates no known allergies.  Family History  Problem Relation Age of Onset  . Hypertension Mother   . Cancer Mother     Cervical  . Hypertension Father   . Anesthesia problems Neg Hx     Social History History  Substance Use Topics  . Smoking status: Former Smoker --  0.25 packs/day for 10 years    Quit date: 08/31/2011  . Smokeless tobacco: Never Used  . Alcohol Use: No    Review of Systems Constitutional: Negative for fever. Eyes: Negative for visual changes. ENT: Negative for sore throat. Cardiovascular: Negative for chest pain. Respiratory: Negative for shortness of breath. Gastrointestinal: Negative for abdominal pain, vomiting and diarrhea. Genitourinary: Negative for dysuria. Musculoskeletal: Negative for back pain.  Skin: Negative for rash. Neurological: Negative for headaches, focal weakness or numbness.  10-point ROS otherwise negative.  ____________________________________________   PHYSICAL EXAM:  VITAL SIGNS: ED Triage Vitals  Enc Vitals Group     BP 12/18/14 1010 120/79 mmHg     Pulse Rate 12/18/14 1010 75     Resp --      Temp 12/18/14 1010 98.6 F (37 C)     Temp Source 12/18/14 1010 Oral     SpO2 12/18/14 1010 100 %     Weight 12/18/14 1010 143 lb (64.864 kg)     Height 12/18/14 1010 5\' 3"  (1.6 m)     Head Cir --      Peak Flow --      Pain Score 12/18/14 1011 4     Pain Loc --      Pain Edu? --      Excl. in GC? --     Constitutional: Alert and oriented. Well appearing and in no  distress. Eyes: Conjunctivae are normal. PERRL. Normal extraocular movements. ENT   Head: Normocephalic and atraumatic.   Nose: No congestion/rhinnorhea.   Mouth/Throat: Mucous membranes are moist.   Neck: No stridor. Hematological/Lymphatic/Immunilogical: No cervical lymphadenopathy. Cardiovascular: Normal rate, regular rhythm. Normal and symmetric distal pulses are present in all extremities. No murmurs, rubs, or gallops. Respiratory: Normal respiratory effort without tachypnea nor retractions. Breath sounds are clear and equal bilaterally. No wheezes/rales/rhonchi. Musculoskeletal: Nontender with normal range of motion in all extremities. No joint effusions.  No lower extremity tenderness nor edema. Exam of the left  hand reveals dorsal swelling to the dorsal MCP of the third digit. She also has bony prominence of the fourth metacarpal dorsally. She has a normal composite fist, normal intrinsic and opposition testing. Neurologic:  Normal speech and language. No gross focal neurologic deficits are appreciated. Speech is normal. No gait instability. Skin:  Skin is warm, dry and intact. No rash noted. Psychiatric: Mood and affect are normal. Speech and behavior are normal. Patient exhibits appropriate insight and judgment.  ____________________________________________   RADIOLOGY  Left Fourth Digit  IMPRESSION: No acute fracture or subluxation. Healing fracture with callus formation mid aspect of fourth metacarpal. This is of indeterminate age. Clinical correlation is necessary.  ____________________________________________   PROCEDURES  Procedure(s) performed: None  Critical Care performed: No  ____________________________________________   INITIAL IMPRESSION / ASSESSMENT AND PLAN / ED COURSE  Patient with a old, fracture of the left fourth metacarpal, which is stable and healing. Left hand sprain which is also stable. Patient therefore referred to orthopedic for further evaluation of old healed fracture.  Pertinent labs & imaging results that were available during my care of the patient were reviewed by me and considered in my medical decision making (see chart for details).    ____________________________________________   FINAL CLINICAL IMPRESSION(S) / ED DIAGNOSES  Final diagnoses:  Hand sprain, left, initial encounter  Metacarpal bone fracture, open, initial encounter    Lissa Hoard, PA-C 12/18/14 1701  Governor Rooks, MD 12/19/14 [phone]

## 2015-01-18 NOTE — ED Provider Notes (Signed)
[facility] Emergency Department Provider Note  ____________________________________________  Time seen: 1125  I have reviewed the triage vital signs and the nursing notes.  HISTORY  Chief Complaint Finger Injury  HPI Connie Greer is a 44 y.o. female right-hand-dominant female with acute injury to the hand today while at work.She describes the fingers on the left hand being hyperextended as they slipped while she was picking up a box.She notes swelling to the dorsal left hand with the third knuckle. Additionally, she notes a fall to the left hand from a stepstool a month ago, for which she never sought care. She is concerned today for a decreased knuckle prominence on the left hand when she forms a composite fist.   Past Medical History  Diagnosis Date  . Heart murmur   . Pregnancy induced hypertension   . Heart palpitations   . PSVT (paroxysmal supraventricular tachycardia)   . Preterm labor   . Seizures 1994    No current hx; during the delivery of first child    Patient Active Problem List   Diagnosis Date Noted  . PSVT (paroxysmal supraventricular tachycardia)   . Pulmonary nodule 11/24/2011  . Heart palpitations-SVT     Past Surgical History  Procedure Laterality Date  . Tonsillectomy    . Tubal ligation  05/19/2012    Procedure: POST PARTUM TUBAL LIGATION;  Surgeon: Allie BossierMyra C Dove, MD;  Location: [facility];  Service: Gynecology;  Laterality: Bilateral;    Current Outpatient Rx  Name  Route  Sig  Dispense  Refill  . Prenatal Vit-Fe Fumarate-FA (PRENATAL MULTIVITAMIN) TABS   Oral   Take 1 tablet by mouth daily.          Allergies Review of patient's allergies indicates no known allergies.  Family History  Problem Relation Age of Onset  . Hypertension Mother   . Cancer Mother     Cervical  . Hypertension Father   . Anesthesia problems Neg Hx     Social History History  Substance Use Topics  . Smoking status: Former Smoker -- 0.25  packs/day for 10 years    Quit date: 08/31/2011  . Smokeless tobacco: Never Used  . Alcohol Use: No    Review of Systems Constitutional: Negative for fever. Eyes: Negative for visual changes. ENT: Negative for sore throat. Cardiovascular: Negative for chest pain. Respiratory: Negative for shortness of breath. Gastrointestinal: Negative for abdominal pain, vomiting and diarrhea. Genitourinary: Negative for dysuria. Musculoskeletal: Negative for back pain.  Skin: Negative for rash. Neurological: Negative for headaches, focal weakness or numbness.  10-point ROS otherwise negative. ___________________________________________  PHYSICAL EXAM:  VITAL SIGNS: ED Triage Vitals  Enc Vitals Group     BP 12/18/14 1010 120/79 mmHg     Pulse Rate 12/18/14 1010 75     Resp --      Temp 12/18/14 1010 98.6 F (37 C)     Temp Source 12/18/14 1010 Oral     SpO2 12/18/14 1010 100 %     Weight 12/18/14 1010 143 lb (64.864 kg)     Height 12/18/14 1010 5\' 3"  (1.6 m)     Head Cir --      Peak Flow --      Pain Score 12/18/14 1011 4     Pain Loc --      Pain Edu? --      Excl. in GC? --     Constitutional: Alert and oriented. Well appearing and in no distress. Eyes: Conjunctivae are  normal. PERRL. Normal extraocular movements. ENT   Head: Normocephalic and atraumatic.   Nose: No congestion/rhinnorhea.   Mouth/Throat: Mucous membranes are moist.   Neck: No stridor. Hematological/Lymphatic/Immunilogical: No cervical lymphadenopathy. Cardiovascular: Normal rate, regular rhythm. Normal and symmetric distal pulses are present in all extremities. No murmurs, rubs, or gallops. Respiratory: Normal respiratory effort without tachypnea nor retractions. Breath sounds are clear and equal bilaterally. No wheezes/rales/rhonchi. Musculoskeletal: Nontender with normal range of motion in all extremities. No joint effusions.  No lower extremity tenderness nor edema. Exam of the left hand reveals  dorsal swelling to the dorsal MCP of the third digit. She also has bony prominence of the fourth metacarpal dorsally. She has a normal composite fist, normal intrinsic and opposition testing. Neurologic:  Normal speech and language. No gross focal neurologic deficits are appreciated. Speech is normal. No gait instability. Skin:  Skin is warm, dry and intact. No rash noted. Psychiatric: Mood and affect are normal. Speech and behavior are normal. Patient exhibits appropriate insight and judgment. ____________________________________________   RADIOLOGY  Left Fourth Digit  IMPRESSION: No acute fracture or subluxation. Healing fracture with callus formation mid aspect of fourth metacarpal. This is of indeterminate age. Clinical correlation is necessary. ____________________________________________  INITIAL IMPRESSION / ASSESSMENT AND PLAN / ED COURSE  Patient with a old, closed, fracture of the left fourth metacarpal shaft, which is stable and healing. Acute left hand sprain which is also stable. Patient therefore referred to orthopedic for further evaluation of old healed fracture. ____________________________________________  FINAL CLINICAL IMPRESSION(S) / ED DIAGNOSES  Final diagnoses:  Hand sprain, left, initial encounter  Fracture of metacarpal of left hand, closed, initial encounter    Lissa Hoard, PA-C 12/18/14 1701  Governor Rooks, MD 12/19/14 0716  Lissa Hoard, PA-C 01/18/15 1034  Governor Rooks, MD 01/18/15 1416
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
## 2016-10-01 ENCOUNTER — Encounter: Payer: Self-pay | Admitting: Cardiovascular Disease

## 2016-10-01 ENCOUNTER — Ambulatory Visit (INDEPENDENT_AMBULATORY_CARE_PROVIDER_SITE_OTHER): Payer: Self-pay | Admitting: Cardiovascular Disease

## 2016-10-01 VITALS — BP 120/70 | HR 89 | Ht 63.0 in | Wt 145.5 lb

## 2016-10-01 DIAGNOSIS — Z720 Tobacco use: Secondary | ICD-10-CM

## 2016-10-01 DIAGNOSIS — I471 Supraventricular tachycardia: Secondary | ICD-10-CM

## 2016-10-01 NOTE — Patient Instructions (Addendum)
Medication Instructions:  Your physician recommends that you continue on your current medications as directed. Please refer to the Current Medication list given to you today.   Labwork: none  Testing/Procedures: Your physician has requested that you have an echocardiogram. Echocardiography is a painless test that uses sound waves to create images of your heart. It provides your doctor with information about the size and shape of your heart and how well your heart's chambers and valves are working. This procedure takes approximately one hour. There are no restrictions for this procedure.    Follow-Up: Your physician wants you to follow-up in: 6 months with Dr. Kirke CorinArida.  You will receive a reminder letter in the mail two months in advance. If you don't receive a letter, please call our office to schedule the follow-up appointment.   Any Other Special Instructions Will Be Listed Below (If Applicable). Please purchase the [employer] monitor     If you need a refill on your cardiac medications before your next appointment, please call your pharmacy.  Echocardiogram An echocardiogram, or echocardiography, uses sound waves (ultrasound) to produce an image of your heart. The echocardiogram is simple, painless, obtained within a short period of time, and offers valuable information to your health care provider. The images from an echocardiogram can provide information such as:  Evidence of coronary artery disease (CAD).  Heart size.  Heart muscle function.  Heart valve function.  Aneurysm detection.  Evidence of a past heart attack.  Fluid buildup around the heart.  Heart muscle thickening.  Assess heart valve function. Tell a health care provider about:  Any allergies you have.  All medicines you are taking, including vitamins, herbs, eye drops, creams, and over-the-counter medicines.  Any problems you or family members have had with anesthetic medicines.  Any blood disorders  you have.  Any surgeries you have had.  Any medical conditions you have.  Whether you are pregnant or may be pregnant. What happens before the procedure? No special preparation is needed. Eat and drink normally. What happens during the procedure?  In order to produce an image of your heart, gel will be applied to your chest and a wand-like tool (transducer) will be moved over your chest. The gel will help transmit the sound waves from the transducer. The sound waves will harmlessly bounce off your heart to allow the heart images to be captured in real-time motion. These images will then be recorded.  You may need an IV to receive a medicine that improves the quality of the pictures. What happens after the procedure? You may return to your normal schedule including diet, activities, and medicines, unless your health care provider tells you otherwise. This information is not intended to replace advice given to you by your health care provider. Make sure you discuss any questions you have with your health care provider. Document Released: 07/31/2000 Document Revised: 03/21/2016 Document Reviewed: 04/10/2013 Elsevier Interactive Patient Education  2017 [employer]

## 2016-10-01 NOTE — Progress Notes (Signed)
Cardiology Office Note   Date:  10/01/2016   ID:  Connie Greer, DOB 1971-08-02, MRN [id number]  PCP:  [facility]  Cardiologist:   Lorine Bears, MD   Chief Complaint  Patient presents with  . other    New Patient. Referral from [facility]. Patient c/o high HR, palpitations, and Chest pain. Patient states she has been really stressed. Meds reviewed verbally with patient.       History of Present Illness: Connie Greer is a 46 y.o. female who presents for A follow-up visit regarding paroxysmal supraventricular tachycardia. She was seen by me in June 2013 for paroxysmal supraventricular tachycardia which responded to adenosine given by EMS. The episode happened during pregnancy. She underwent an echocardiogram which was normal. CTA showed no evidence of pulmonary embolism. Since that time, she had 3 episodes of tachycardia one in November 2014, the second was in November 2017 and most recently in January 2018. The heart rate was 180 bpm and these episodes lasted from 2-4 hours. He responded to vagal maneuvers. She cut down on caffeine intake. She smokes about half a pack per week. She reports being under significant stress lately especially at work. She is a Electrical engineer. She reports intermittent episodes of chest pain when she is anxious. No significant exertional dyspnea. No syncope or presyncope.  Past Medical History:  Diagnosis Date  . Heart murmur   . Heart palpitations   . Pregnancy induced hypertension   . Preterm labor   . PSVT (paroxysmal supraventricular tachycardia) (HCC)   . Seizures (HCC) 1994   No current hx; during the delivery of first child    Past Surgical History:  Procedure Laterality Date  . TONSILLECTOMY    . TUBAL LIGATION  05/19/2012   Procedure: POST PARTUM TUBAL LIGATION;  Surgeon: Allie Bossier, MD;  Location: [facility];  Service: Gynecology;  Laterality: Bilateral;     Current Outpatient Prescriptions  Medication Sig Dispense Refill   . omega-3 acid ethyl esters (LOVAZA) 1 g capsule Take by mouth daily.     No current facility-administered medications for this visit.     Allergies:   Patient has no known allergies.    Social History:  The patient  reports that she quit smoking about 5 years ago. She has a 2.50 pack-year smoking history. She has never used smokeless tobacco. She reports that she does not drink alcohol or use drugs.   Family History:  The patient's family history includes Cancer in her mother; Hypertension in her father and mother.    ROS:  Please see the history of present illness.   Otherwise, review of systems are positive for none.   All other systems are reviewed and negative.    PHYSICAL EXAM: VS:  BP 120/70 (BP Location: Right Arm, Patient Position: Sitting, Cuff Size: Normal)   Pulse 89   Ht 5\' 3"  (1.6 m)   Wt 145 lb 8 oz (66 kg)   BMI 25.77 kg/m  , BMI Body mass index is 25.77 kg/m. GEN: Well nourished, well developed, in no acute distress  HEENT: normal  Neck: no JVD, carotid bruits, or masses Cardiac: RRR; no murmurs, rubs, or gallops,no edema  Respiratory:  clear to auscultation bilaterally, normal work of breathing GI: soft, nontender, nondistended, + BS MS: no deformity or atrophy  Skin: warm and dry, no rash Neuro:  Strength and sensation are intact Psych: euthymic mood, full affect   EKG:  EKG is ordered today. The ekg  ordered today demonstrates  normal sinus rhythm with no significant ST or T wave changes.   Recent Labs: No results found for requested labs within last 8760 hours.    Lipid Panel No results found for: CHOL, TRIG, HDL, CHOLHDL, VLDL, LDLCALC, LDLDIRECT    Wt Readings from Last 3 Encounters:  10/01/16 145 lb 8 oz (66 kg)  12/18/14 143 lb (64.9 kg)  08/01/12 132 lb (59.9 kg)        PAD Screen 10/01/2016  Previous PAD dx? No  Previous surgical procedure? No  Pain with walking? No  Feet/toe relief with dangling? No  Painful, non-healing  ulcers? No  Extremities discolored? No      ASSESSMENT AND PLAN:  1.  Paroxysmal supraventricular tachycardia: She had 2 episodes since November. However, these episodes are still not happening frequently enough to be captured on regular monitoring. Thus, I think the best option for her is to purchase a Smart phone monitor that she can have all the time. She is going to do that and record any tachycardia and then we can review. I requested an echocardiogram to ensure no change from prior echo in 2013. I recommend checking thyroid function and other routine labs if that has not been already done. She already cut down on caffeine intake. I suspect that stress and anxiety is contributing to some of these episodes. If these episodes become more frequent, medications or ablation can be considered.  2. Tobacco use: She smokes when she is anxious but wants to quit smoking.    Disposition:   FU with me in 6 months  Signed,  Lorine BearsMuhammad Wojciech Willetts, MD  10/01/2016 9:23 AM    [facility]

## 2016-10-14 ENCOUNTER — Other Ambulatory Visit: Payer: Self-pay

## 2016-10-14 ENCOUNTER — Ambulatory Visit (INDEPENDENT_AMBULATORY_CARE_PROVIDER_SITE_OTHER): Payer: Self-pay

## 2016-10-14 DIAGNOSIS — I471 Supraventricular tachycardia: Secondary | ICD-10-CM

## 2016-10-16 ENCOUNTER — Other Ambulatory Visit: Payer: Self-pay

## 2018-03-29 ENCOUNTER — Ambulatory Visit: Payer: Self-pay

## 2018-12-13 ENCOUNTER — Telehealth: Payer: Self-pay

## 2018-12-13 NOTE — Telephone Encounter (Signed)
Call to patient from recall list to make appt. Pt last seen by Dr. Kirke Corin Feb 2018. She reports that she is doing well with no complaint. She denies taking any medication and does not want another appt. I made pt aware that if 3 years pass that she would need new referral in the future. Pt verbalized understanding and had no further needs a this time.

## 2019-12-27 ENCOUNTER — Telehealth: Payer: Self-pay | Admitting: Cardiovascular Disease

## 2019-12-27 NOTE — Telephone Encounter (Signed)
Spoke with the patient. Advised her that she will need to be seen and examined by one of our providers before an Rx can be written. Advised her to keep her scheduled appt on 12/29/19 with Dr. Azucena Cecil.  Patient verbalized understanding.

## 2019-12-27 NOTE — Telephone Encounter (Signed)
Patient was recently in [facility] and just released.  Patient states she received adenosine and lorazepam. Patient was told to call and schedule a follow up and obtain a prescription for patient to help with heart palpitations.   Patient has not been here since 2/18 so she is now coming in Friday with [facility]

## 2019-12-29 ENCOUNTER — Other Ambulatory Visit: Payer: Self-pay

## 2019-12-29 ENCOUNTER — Ambulatory Visit (INDEPENDENT_AMBULATORY_CARE_PROVIDER_SITE_OTHER): Payer: Self-pay | Admitting: Cardiology

## 2019-12-29 ENCOUNTER — Encounter: Payer: Self-pay | Admitting: Cardiology

## 2019-12-29 VITALS — BP 110/66 | HR 94 | Ht 63.0 in | Wt 146.0 lb

## 2019-12-29 DIAGNOSIS — F17200 Nicotine dependence, unspecified, uncomplicated: Secondary | ICD-10-CM

## 2019-12-29 DIAGNOSIS — I471 Supraventricular tachycardia: Secondary | ICD-10-CM

## 2019-12-29 MED ORDER — METOPROLOL SUCCINATE ER 25 MG PO TB24: 25.0000 mg | ORAL_TABLET | Freq: Every day | ORAL | 6 refills | Status: DC

## 2019-12-29 NOTE — Progress Notes (Signed)
Cardiology Office Note:    Date:  12/29/2019   ID:  Connie Greer, DOB 19-Jan-1971, MRN [id number]  PCP:  [facility]  Cardiologist:  Debbe Odea, MD  Electrophysiologist:  None   Referring MD: [facility]   Chief Complaint  Patient presents with  . New Patient (Initial Visit)    Ed follow up form [facility]. Patient c.o Palpitations. Meds reviewed verbally with patient.    [name] is a 49 y.o. female who is being seen today for the evaluation of palpitations.  At the request of [facility]   History of Present Illness:    Connie Greer is a 49 y.o. female with a hx of palpitations, paroxysmal SVT, current smoker x15 years who presents due to palpitations.  She has documented history of SVT as far back as 2013.  Previously evaluated by Dr. Kirke Corin for paroxysmal SVT responding to adenosine by EMS in 2013.  Echo at the time was normal.  Since then she had 3 more episodes in 2014, 2017, 2018 with heart rates up to 180 which responded to vagal maneuvers.  Episodes where very infrequent hence medications or ablation was not considered at the time.  Echocardiogram in 2018 showed normal systolic and diastolic function, EF 60 to 65%.  Patient was at work 2 days ago when she suddenly felt increased heart rates and palpitations.  She presented to the ED at [facility] on 12/27/2019 due to palpitations.  EKG on arrival showed SVT with heart rates 210-220.  She was managed with 6 mg of adenosine with resolution of SVT.  Patient was then discharged, with recommendations for outpatient follow-up.  Previous occurrence of similar symptoms was 2 months ago.  Past Medical History:  Diagnosis Date  . Heart murmur   . Heart palpitations   . Pregnancy induced hypertension   . Preterm labor   . PSVT (paroxysmal supraventricular tachycardia) (HCC)   . Seizures (HCC) 1994   No current hx; during the delivery of first child    Past Surgical History:    Procedure Laterality Date  . TONSILLECTOMY    . TUBAL LIGATION  05/19/2012   Procedure: POST PARTUM TUBAL LIGATION;  Surgeon: Allie Bossier, MD;  Location: [facility];  Service: Gynecology;  Laterality: Bilateral;    Current Medications: Current Meds  Medication Sig  . ascorbic acid (VITAMIN C) 500 MG tablet Take by mouth.  . ergocalciferol (VITAMIN D2) 1.25 MG (50000 UT) capsule Take by mouth.  . Multiple Vitamin (MULTI-VITAMIN) tablet Take by mouth.  . omega-3 acid ethyl esters (LOVAZA) 1 g capsule Take by mouth daily.     Allergies:   Patient has no known allergies.   Social History   Socioeconomic History  . Marital status: Married    Spouse name: Not on file  . Number of children: Not on file  . Years of education: Not on file  . Highest education level: Not on file  Occupational History  . Not on file  Tobacco Use  . Smoking status: Former Smoker    Packs/day: 0.25    Years: 10.00    Pack years: 2.50    Quit date: 08/31/2011    Years since quitting: 8.3  . Smokeless tobacco: Never Used  Substance and Sexual Activity  . Alcohol use: No  . Drug use: No  . Sexual activity: Yes    Birth control/protection: None    Comment: tubaligation  Other Topics Concern  . Not on  file  Social History Narrative  . Not on file   Social Determinants of Health   Financial Resource Strain:   . Difficulty of Paying Living Expenses:   Food Insecurity:   . Worried About Programme researcher, broadcasting/film/video in the Last Year:   . Barista in the Last Year:   Transportation Needs:   . Freight forwarder (Medical):   Marland Kitchen Lack of Transportation (Non-Medical):   Physical Activity:   . Days of Exercise per Week:   . Minutes of Exercise per Session:   Stress:   . Feeling of Stress :   Social Connections:   . Frequency of Communication with Friends and Family:   . Frequency of Social Gatherings with Friends and Family:   . Attends Religious Services:   . Active Member of Clubs or  Organizations:   . Attends Banker Meetings:   Marland Kitchen Marital Status:      Family History: The patient's family history includes Cancer in her mother; Hypertension in her father and mother. There is no history of Anesthesia problems.  ROS:   Please see the history of present illness.     All other systems reviewed and are negative.  EKGs/Labs/Other Studies Reviewed:    The following studies were reviewed today:   EKG:  EKG is  ordered today.  The ekg ordered today demonstrates normal sinus rhythm, normal EKG.  Recent Labs: No results found for requested labs within last 8760 hours.  Recent Lipid Panel No results found for: CHOL, TRIG, HDL, CHOLHDL, VLDL, LDLCALC, LDLDIRECT  Physical Exam:    VS:  BP 110/66 (BP Location: Right Arm, Patient Position: Sitting, Cuff Size: Normal)   Pulse 94   Ht  (1.6 m)   Wt 146 lb (66.2 kg)   SpO2 98%   BMI 25.86 kg/m     Wt Readings from Last 3 Encounters:  12/29/19 146 lb (66.2 kg)  10/01/16 145 lb 8 oz (66 kg)  12/18/14 143 lb (64.9 kg)     GEN:  Well nourished, well developed in no acute distress HEENT: Normal NECK: No JVD; No carotid bruits LYMPHATICS: No lymphadenopathy CARDIAC: RRR, no murmurs, rubs, gallops RESPIRATORY:  Clear to auscultation without rales, wheezing or rhonchi  ABDOMEN: Soft, non-tender, non-distended MUSCULOSKELETAL:  No edema; No deformity  SKIN: Warm and dry NEUROLOGIC:  Alert and oriented x 3 PSYCHIATRIC:  Normal affect   ASSESSMENT:    1. PSVT (paroxysmal supraventricular tachycardia) (HCC)   2. Smoking    PLAN:    In order of problems listed above:  1. Patient with history of palpitations, paroxysmal SVT.  Recent ED admission with SVT, resolved after adenosine administration.  Start Toprol-XL 25 mg daily.  Will refer to EP for ablation consideration.  Repeat echocardiogram to rule out any new structural abnormalities.  We will request EKG from [facility] showing SVT. 2. Patient is a  current smoker x15 years.  Smoking cessation advised.  Follow-up after echocardiogram.   Medication Adjustments/Labs and Tests Ordered: Current medicines are reviewed at length with the patient today.  Concerns regarding medicines are outlined above.  Orders Placed This Encounter  Procedures  . Ambulatory referral to Cardiac Electrophysiology  . EKG 12-Lead  . ECHOCARDIOGRAM COMPLETE   Meds ordered this encounter  Medications  . metoprolol succinate (TOPROL-XL) 25 MG 24 hr tablet    Sig: Take 1 tablet (25 mg total) by mouth daily.    Dispense:  30 tablet  Refill:  6    Patient Instructions  Medication Instructions:  - Your physician has recommended you make the following change in your medication:   1) START metoprolol succinate (Toprol XL) 25 mg- take 1 tablet by mouth once daily    *If you need a refill on your cardiac medications before your next appointment, please call your pharmacy*   Lab Work: - none ordered  If you have labs (blood work) drawn today and your tests are completely normal, you will receive your results only by: Marland Kitchen MyChart Message (if you have MyChart) OR . A paper copy in the mail If you have any lab test that is abnormal or we need to change your treatment, we will call you to review the results.   Testing/Procedures: - Your physician has requested that you have an echocardiogram. Echocardiography is a painless test that uses sound waves to create images of your heart. It provides your doctor with information about the size and shape of your heart and how well your heart's chambers and valves are working. This procedure takes approximately one hour. There are no restrictions for this procedure.   Follow-Up: At [facility], you and your health needs are our priority.  As part of our continuing mission to provide you with exceptional heart care, we have created designated Provider Care Teams.  These Care Teams include your primary Cardiologist  (physician) and Advanced Practice Providers (APPs -  Physician Assistants and Nurse Practitioners) who all work together to provide you with the care you need, when you need it.  We recommend signing up for the patient portal called "MyChart".  Sign up information is provided on this After Visit Summary.  MyChart is used to connect with patients for Virtual Visits (Telemedicine).  Patients are able to view lab/test results, encounter notes, upcoming appointments, etc.  Non-urgent messages can be sent to your provider as well.   To learn more about what you can do with MyChart, go to [email or web address].    Your next appointment:   Pending Electrophysiology work up   The format for your next appointment:   In Person  Provider:   Kate Sable, MD   Other Instructions - You have been referred to :  Dr. Cristopher Peru in our [place] office for consideration of SVT ablation (his scheduler, Doylene Canning, will call you to arrange an appointment).    Echocardiogram An echocardiogram is a procedure that uses painless sound waves (ultrasound) to produce an image of the heart. Images from an echocardiogram can provide important information about:  Signs of coronary artery disease (CAD).  Aneurysm detection. An aneurysm is a weak or damaged part of an artery wall that bulges out from the normal force of blood pumping through the body.  Heart size and shape. Changes in the size or shape of the heart can be associated with certain conditions, including heart failure, aneurysm, and CAD.  Heart muscle function.  Heart valve function.  Signs of a past heart attack.  Fluid buildup around the heart.  Thickening of the heart muscle.  A tumor or infectious growth around the heart valves. Tell a health care provider about:  Any allergies you have.  All medicines you are taking, including vitamins, herbs, eye drops, creams, and over-the-counter medicines.  Any blood disorders you  have.  Any surgeries you have had.  Any medical conditions you have.  Whether you are pregnant or may be pregnant. What are the risks? Generally, this is a safe procedure. However,  problems may occur, including:  Allergic reaction to dye (contrast) that may be used during the procedure. What happens before the procedure? No specific preparation is needed. You may eat and drink normally. What happens during the procedure?   An IV tube may be inserted into one of your veins.  You may receive contrast through this tube. A contrast is an injection that improves the quality of the pictures from your heart.  A gel will be applied to your chest.  A wand-like tool (transducer) will be moved over your chest. The gel will help to transmit the sound waves from the transducer.  The sound waves will harmlessly bounce off of your heart to allow the heart images to be captured in real-time motion. The images will be recorded on a computer. The procedure may vary among health care providers and hospitals. What happens after the procedure?  You may return to your normal, everyday life, including diet, activities, and medicines, unless your health care provider tells you not to do that. Summary  An echocardiogram is a procedure that uses painless sound waves (ultrasound) to produce an image of the heart.  Images from an echocardiogram can provide important information about the size and shape of your heart, heart muscle function, heart valve function, and fluid buildup around your heart.  You do not need to do anything to prepare before this procedure. You may eat and drink normally.  After the echocardiogram is completed, you may return to your normal, everyday life, unless your health care provider tells you not to do that. This information is not intended to replace advice given to you by your health care provider. Make sure you discuss any questions you have with your health care  provider. Document Revised: 11/24/2018 Document Reviewed: 09/05/2016 Elsevier Patient Education  2020 Elsevier Inc.   Supraventricular Tachycardia, Adult Supraventricular tachycardia (SVT) is a kind of abnormal heartbeat. It makes your heart beat very fast and then beat at a normal speed. A normal resting heartbeat is 60-100 times a minute. This condition can make your heart beat more than 150 times a minute. Times of having a fast heartbeat (episodes) can be scary, but they are usually not dangerous. In some cases, they may lead to heart failure if:  They happen many times per day.  Last longer than a few seconds. What are the causes?  A normal heartbeat starts when an area called the sinoatrial node sends out an electrical signal. In SVT, other areas of the heart send out signals that get in the way of the signal from the sinoatrial node. What increases the risk? You are more likely to develop this condition if you are:  48-69 years old.  A woman. The following factors may make you more likely to develop this condition:  Stress.  Tiredness.  Smoking.  Stimulant drugs, such as cocaine and methamphetamine.  Alcohol.  Caffeine.  Pregnancy.  Feeling worried or nervous (anxiety). What are the signs or symptoms?  A pounding heart.  A feeling that your heart is skipping beats (palpitations).  Weakness.  Trouble getting enough air.  Pain or tightness in your chest.  Feeling like you are going to pass out (faint).  Feeling worried or nervous.  Dizziness.  Sweating.  Feeling sick to your stomach (nausea).  Passing out.  Tiredness. Sometimes, there are no symptoms. How is this treated?  Vagal nerve stimulation. Ways to do this include: ? Holding your breath and pushing, as though you are pooping (having  a bowel movement). ? Massaging an area on one side of your neck. Do not try this yourself. Only a doctor should do this. If done the wrong way, it can lead  to a stroke. ? Bending forward with your head between your legs. ? Coughing while bending forward with your head between your legs. ? Closing your eyes and massaging your eyeballs. Ask a doctor how to do this.  Medicines that prevent attacks.  Medicine to stop an attack given through an IV tube at the hospital.  A small electric shock (cardioversion) that stops an attack.  Radiofrequency ablation. In this procedure, a small, thin tube (catheter) is used to send energy to the area that is causing the rapid heartbeats. If you do not have symptoms, you may not need treatment. Follow these instructions at home: Stress  Avoid things that make you feel stressed.  To deal with stress, try: ? Doing yoga or meditation, or being out in nature. ? Listening to relaxing music. ? Doing deep breathing. ? Taking steps to be healthy, such as getting lots of sleep, exercising, and eating a balanced diet. ? Talking with a mental health doctor. Lifestyle   Try to get at least 7 hours of sleep each night.  Do not use any products that contain nicotine or tobacco, such as cigarettes, e-cigarettes, and chewing tobacco. If you need help quitting, ask your doctor.  Be aware of how alcohol affects you. ? If alcohol gives you a fast heartbeat, do not drink alcohol. ? If alcohol does not seem to give you a fast heartbeat, limit alcohol use to no more than 1 drink a day for women who are not pregnant, and 2 drinks a day for men. In the [place] one drink is one of these:  12 oz of beer (355 mL).  5 oz of wine (148 mL).  1 oz of hard liquor (44 mL).  Be aware of how caffeine affects you. ? If caffeine gives you a fast heartbeat, do not eat, drink, or use anything with caffeine in it. ? If caffeine does not seem to give you a fast heartbeat, limit how much caffeine you eat, drink, or use.  Do not use stimulant drugs. If you need help quitting, ask your doctor. General instructions  Stay at a healthy  weight.  Exercise regularly. Ask your doctor about good activities for you. Try one or a mixture of these: ? 150 minutes a week of gentle exercise, like walking or yoga. ? 75 minutes a week of exercise that is very active, like running or swimming.  Do vagus nerve treatments to slow down your heartbeat as told by your doctor.  Take over-the-counter and prescription medicines only as told by your doctor.  Keep all follow-up visits as told by your doctor. This is important. Contact a doctor if:  You have a fast heartbeat more often.  Times of having a fast heartbeat last longer than before.  Home treatments to slow down your heartbeat do not help.  You have new symptoms. Get help right away if:  You have chest pain.  Your symptoms get worse.  You have trouble breathing.  Your heart beats very fast for more than 20 minutes.  You pass out. These symptoms may be an emergency. Do not wait to see if the symptoms will go away. Get medical help right away. Call your local emergency services (911 in the [place] Do not drive yourself to the hospital. Summary  SVT is a  type of abnormal heart beat.  This condition can make your heart beat more than 150 times a minute.  Treatment depends on how often the condition happens and your symptoms. This information is not intended to replace advice given to you by your health care provider. Make sure you discuss any questions you have with your health care provider. Document Revised: 06/21/2018 Document Reviewed: 06/21/2018 Elsevier Patient Education  2020 Elsevier Inc.   Cardiac Ablation Cardiac ablation is a procedure to disable (ablate) a small amount of heart tissue in very specific places. The heart has many electrical connections. Sometimes these connections are abnormal and can cause the heart to beat very fast or irregularly. Ablating some of the problem areas can improve the heart rhythm or return it to normal. Ablation may be done  for people who:  Have Wolff-Parkinson-White syndrome.  Have fast heart rhythms (tachycardia).  Have taken medicines for an abnormal heart rhythm (arrhythmia) that were not effective or caused side effects.  Have a high-risk heartbeat that may be life-threatening. During the procedure, a small incision is made in the neck or the groin, and a long, thin, flexible tube (catheter) is inserted into the incision and moved to the heart. Small devices (electrodes) on the tip of the catheter will send out electrical currents. A type of X-ray (fluoroscopy) will be used to help guide the catheter and to provide images of the heart. Tell a health care provider about:  Any allergies you have.  All medicines you are taking, including vitamins, herbs, eye drops, creams, and over-the-counter medicines.  Any problems you or family members have had with anesthetic medicines.  Any blood disorders you have.  Any surgeries you have had.  Any medical conditions you have, such as kidney failure.  Whether you are pregnant or may be pregnant. What are the risks? Generally, this is a safe procedure. However, problems may occur, including:  Infection.  Bruising and bleeding at the catheter insertion site.  Bleeding into the chest, especially into the sac that surrounds the heart. This is a serious complication.  Stroke or blood clots.  Damage to other structures or organs.  Allergic reaction to medicines or dyes.  Need for a permanent pacemaker if the normal electrical system is damaged. A pacemaker is a small computer that sends electrical signals to the heart and helps your heart beat normally.  The procedure not being fully effective. This may not be recognized until months later. Repeat ablation procedures are sometimes required. What happens before the procedure?  Follow instructions from your health care provider about eating or drinking restrictions.  Ask your health care provider  about: ? Changing or stopping your regular medicines. This is especially important if you are taking diabetes medicines or blood thinners. ? Taking medicines such as aspirin and ibuprofen. These medicines can thin your blood. Do not take these medicines before your procedure if your health care provider instructs you not to.  Plan to have someone take you home from the hospital or clinic.  If you will be going home right after the procedure, plan to have someone with you for 24 hours. What happens during the procedure?  To lower your risk of infection: ? Your health care team will wash or sanitize their hands. ? Your skin will be washed with soap. ? Hair may be removed from the incision area.  An IV tube will be inserted into one of your veins.  You will be given a medicine to help you  relax (sedative).  The skin on your neck or groin will be numbed.  An incision will be made in your neck or your groin.  A needle will be inserted through the incision and into a large vein in your neck or groin.  A catheter will be inserted into the needle and moved to your heart.  Dye may be injected through the catheter to help your surgeon see the area of the heart that needs treatment.  Electrical currents will be sent from the catheter to ablate heart tissue in desired areas. There are three types of energy that may be used to ablate heart tissue: ? Heat (radiofrequency energy). ? Laser energy. ? Extreme cold (cryoablation).  When the necessary tissue has been ablated, the catheter will be removed.  Pressure will be held on the catheter insertion area to prevent excessive bleeding.  A bandage (dressing) will be placed over the catheter insertion area. The procedure may vary among health care providers and hospitals. What happens after the procedure?  Your blood pressure, heart rate, breathing rate, and blood oxygen level will be monitored until the medicines you were given have worn  off.  Your catheter insertion area will be monitored for bleeding. You will need to lie still for a few hours to ensure that you do not bleed from the catheter insertion area.  Do not drive for 24 hours or as long as directed by your health care provider. Summary  Cardiac ablation is a procedure to disable (ablate) a small amount of heart tissue in very specific places. Ablating some of the problem areas can improve the heart rhythm or return it to normal.  During the procedure, electrical currents will be sent from the catheter to ablate heart tissue in desired areas. This information is not intended to replace advice given to you by your health care provider. Make sure you discuss any questions you have with your health care provider. Document Revised: 01/24/2018 Document Reviewed: 06/22/2016 Elsevier Patient Education  2020 [employer].     Signed, Debbe Odea, MD  12/29/2019 12:20 PM    [facility]

## 2019-12-29 NOTE — Patient Instructions (Addendum)
Medication Instructions:  - Your physician has recommended you make the following change in your medication:   1) START metoprolol succinate (Toprol XL) 25 mg- take 1 tablet by mouth once daily    *If you need a refill on your cardiac medications before your next appointment, please call your pharmacy*   Lab Work: - none ordered  If you have labs (blood work) drawn today and your tests are completely normal, you will receive your results only by: Marland Kitchen MyChart Message (if you have MyChart) OR . A paper copy in the mail If you have any lab test that is abnormal or we need to change your treatment, we will call you to review the results.   Testing/Procedures: - Your physician has requested that you have an echocardiogram. Echocardiography is a painless test that uses sound waves to create images of your heart. It provides your doctor with information about the size and shape of your heart and how well your heart's chambers and valves are working. This procedure takes approximately one hour. There are no restrictions for this procedure.   Follow-Up: At [facility], you and your health needs are our priority.  As part of our continuing mission to provide you with exceptional heart care, we have created designated Provider Care Teams.  These Care Teams include your primary Cardiologist (physician) and Advanced Practice Providers (APPs -  Physician Assistants and Nurse Practitioners) who all work together to provide you with the care you need, when you need it.  We recommend signing up for the patient portal called "MyChart".  Sign up information is provided on this After Visit Summary.  MyChart is used to connect with patients for Virtual Visits (Telemedicine).  Patients are able to view lab/test results, encounter notes, upcoming appointments, etc.  Non-urgent messages can be sent to your provider as well.   To learn more about what you can do with MyChart, go to [email or web address].     Your next appointment:   Pending Electrophysiology work up   The format for your next appointment:   In Person  Provider:   Debbe Odea, MD   Other Instructions - You have been referred to :  Dr. Lewayne Bunting in our [place] office for consideration of SVT ablation (his scheduler, Virgina Evener, will call you to arrange an appointment).    Echocardiogram An echocardiogram is a procedure that uses painless sound waves (ultrasound) to produce an image of the heart. Images from an echocardiogram can provide important information about:  Signs of coronary artery disease (CAD).  Aneurysm detection. An aneurysm is a weak or damaged part of an artery wall that bulges out from the normal force of blood pumping through the body.  Heart size and shape. Changes in the size or shape of the heart can be associated with certain conditions, including heart failure, aneurysm, and CAD.  Heart muscle function.  Heart valve function.  Signs of a past heart attack.  Fluid buildup around the heart.  Thickening of the heart muscle.  A tumor or infectious growth around the heart valves. Tell a health care provider about:  Any allergies you have.  All medicines you are taking, including vitamins, herbs, eye drops, creams, and over-the-counter medicines.  Any blood disorders you have.  Any surgeries you have had.  Any medical conditions you have.  Whether you are pregnant or may be pregnant. What are the risks? Generally, this is a safe procedure. However, problems may occur, including:  Allergic reaction to dye (contrast)  that may be used during the procedure. What happens before the procedure? No specific preparation is needed. You may eat and drink normally. What happens during the procedure?   An IV tube may be inserted into one of your veins.  You may receive contrast through this tube. A contrast is an injection that improves the quality of the pictures from your  heart.  A gel will be applied to your chest.  A wand-like tool (transducer) will be moved over your chest. The gel will help to transmit the sound waves from the transducer.  The sound waves will harmlessly bounce off of your heart to allow the heart images to be captured in real-time motion. The images will be recorded on a computer. The procedure may vary among health care providers and hospitals. What happens after the procedure?  You may return to your normal, everyday life, including diet, activities, and medicines, unless your health care provider tells you not to do that. Summary  An echocardiogram is a procedure that uses painless sound waves (ultrasound) to produce an image of the heart.  Images from an echocardiogram can provide important information about the size and shape of your heart, heart muscle function, heart valve function, and fluid buildup around your heart.  You do not need to do anything to prepare before this procedure. You may eat and drink normally.  After the echocardiogram is completed, you may return to your normal, everyday life, unless your health care provider tells you not to do that. This information is not intended to replace advice given to you by your health care provider. Make sure you discuss any questions you have with your health care provider. Document Revised: 11/24/2018 Document Reviewed: 09/05/2016 Elsevier Patient Education  2020 Elsevier Inc.   Supraventricular Tachycardia, Adult Supraventricular tachycardia (SVT) is a kind of abnormal heartbeat. It makes your heart beat very fast and then beat at a normal speed. A normal resting heartbeat is 60-100 times a minute. This condition can make your heart beat more than 150 times a minute. Times of having a fast heartbeat (episodes) can be scary, but they are usually not dangerous. In some cases, they may lead to heart failure if:  They happen many times per day.  Last longer than a few  seconds. What are the causes?  A normal heartbeat starts when an area called the sinoatrial node sends out an electrical signal. In SVT, other areas of the heart send out signals that get in the way of the signal from the sinoatrial node. What increases the risk? You are more likely to develop this condition if you are:  30-41 years old.  A woman. The following factors may make you more likely to develop this condition:  Stress.  Tiredness.  Smoking.  Stimulant drugs, such as cocaine and methamphetamine.  Alcohol.  Caffeine.  Pregnancy.  Feeling worried or nervous (anxiety). What are the signs or symptoms?  A pounding heart.  A feeling that your heart is skipping beats (palpitations).  Weakness.  Trouble getting enough air.  Pain or tightness in your chest.  Feeling like you are going to pass out (faint).  Feeling worried or nervous.  Dizziness.  Sweating.  Feeling sick to your stomach (nausea).  Passing out.  Tiredness. Sometimes, there are no symptoms. How is this treated?  Vagal nerve stimulation. Ways to do this include: ? Holding your breath and pushing, as though you are pooping (having a bowel movement). ? Massaging an area on one  side of your neck. Do not try this yourself. Only a doctor should do this. If done the wrong way, it can lead to a stroke. ? Bending forward with your head between your legs. ? Coughing while bending forward with your head between your legs. ? Closing your eyes and massaging your eyeballs. Ask a doctor how to do this.  Medicines that prevent attacks.  Medicine to stop an attack given through an IV tube at the hospital.  A small electric shock (cardioversion) that stops an attack.  Radiofrequency ablation. In this procedure, a small, thin tube (catheter) is used to send energy to the area that is causing the rapid heartbeats. If you do not have symptoms, you may not need treatment. Follow these instructions at  home: Stress  Avoid things that make you feel stressed.  To deal with stress, try: ? Doing yoga or meditation, or being out in nature. ? Listening to relaxing music. ? Doing deep breathing. ? Taking steps to be healthy, such as getting lots of sleep, exercising, and eating a balanced diet. ? Talking with a mental health doctor. Lifestyle   Try to get at least 7 hours of sleep each night.  Do not use any products that contain nicotine or tobacco, such as cigarettes, e-cigarettes, and chewing tobacco. If you need help quitting, ask your doctor.  Be aware of how alcohol affects you. ? If alcohol gives you a fast heartbeat, do not drink alcohol. ? If alcohol does not seem to give you a fast heartbeat, limit alcohol use to no more than 1 drink a day for women who are not pregnant, and 2 drinks a day for men. In the [place] one drink is one of these:  12 oz of beer (355 mL).  5 oz of wine (148 mL).  1 oz of hard liquor (44 mL).  Be aware of how caffeine affects you. ? If caffeine gives you a fast heartbeat, do not eat, drink, or use anything with caffeine in it. ? If caffeine does not seem to give you a fast heartbeat, limit how much caffeine you eat, drink, or use.  Do not use stimulant drugs. If you need help quitting, ask your doctor. General instructions  Stay at a healthy weight.  Exercise regularly. Ask your doctor about good activities for you. Try one or a mixture of these: ? 150 minutes a week of gentle exercise, like walking or yoga. ? 75 minutes a week of exercise that is very active, like running or swimming.  Do vagus nerve treatments to slow down your heartbeat as told by your doctor.  Take over-the-counter and prescription medicines only as told by your doctor.  Keep all follow-up visits as told by your doctor. This is important. Contact a doctor if:  You have a fast heartbeat more often.  Times of having a fast heartbeat last longer than before.  Home  treatments to slow down your heartbeat do not help.  You have new symptoms. Get help right away if:  You have chest pain.  Your symptoms get worse.  You have trouble breathing.  Your heart beats very fast for more than 20 minutes.  You pass out. These symptoms may be an emergency. Do not wait to see if the symptoms will go away. Get medical help right away. Call your local emergency services (911 in the [place] Do not drive yourself to the hospital. Summary  SVT is a type of abnormal heart beat.  This condition can  make your heart beat more than 150 times a minute.  Treatment depends on how often the condition happens and your symptoms. This information is not intended to replace advice given to you by your health care provider. Make sure you discuss any questions you have with your health care provider. Document Revised: 06/21/2018 Document Reviewed: 06/21/2018 Elsevier Patient Education  2020 Elsevier Inc.   Cardiac Ablation Cardiac ablation is a procedure to disable (ablate) a small amount of heart tissue in very specific places. The heart has many electrical connections. Sometimes these connections are abnormal and can cause the heart to beat very fast or irregularly. Ablating some of the problem areas can improve the heart rhythm or return it to normal. Ablation may be done for people who:  Have Wolff-Parkinson-White syndrome.  Have fast heart rhythms (tachycardia).  Have taken medicines for an abnormal heart rhythm (arrhythmia) that were not effective or caused side effects.  Have a high-risk heartbeat that may be life-threatening. During the procedure, a small incision is made in the neck or the groin, and a long, thin, flexible tube (catheter) is inserted into the incision and moved to the heart. Small devices (electrodes) on the tip of the catheter will send out electrical currents. A type of X-ray (fluoroscopy) will be used to help guide the catheter and to provide  images of the heart. Tell a health care provider about:  Any allergies you have.  All medicines you are taking, including vitamins, herbs, eye drops, creams, and over-the-counter medicines.  Any problems you or family members have had with anesthetic medicines.  Any blood disorders you have.  Any surgeries you have had.  Any medical conditions you have, such as kidney failure.  Whether you are pregnant or may be pregnant. What are the risks? Generally, this is a safe procedure. However, problems may occur, including:  Infection.  Bruising and bleeding at the catheter insertion site.  Bleeding into the chest, especially into the sac that surrounds the heart. This is a serious complication.  Stroke or blood clots.  Damage to other structures or organs.  Allergic reaction to medicines or dyes.  Need for a permanent pacemaker if the normal electrical system is damaged. A pacemaker is a small computer that sends electrical signals to the heart and helps your heart beat normally.  The procedure not being fully effective. This may not be recognized until months later. Repeat ablation procedures are sometimes required. What happens before the procedure?  Follow instructions from your health care provider about eating or drinking restrictions.  Ask your health care provider about: ? Changing or stopping your regular medicines. This is especially important if you are taking diabetes medicines or blood thinners. ? Taking medicines such as aspirin and ibuprofen. These medicines can thin your blood. Do not take these medicines before your procedure if your health care provider instructs you not to.  Plan to have someone take you home from the hospital or clinic.  If you will be going home right after the procedure, plan to have someone with you for 24 hours. What happens during the procedure?  To lower your risk of infection: ? Your health care team will wash or sanitize their  hands. ? Your skin will be washed with soap. ? Hair may be removed from the incision area.  An IV tube will be inserted into one of your veins.  You will be given a medicine to help you relax (sedative).  The skin on your neck or  groin will be numbed.  An incision will be made in your neck or your groin.  A needle will be inserted through the incision and into a large vein in your neck or groin.  A catheter will be inserted into the needle and moved to your heart.  Dye may be injected through the catheter to help your surgeon see the area of the heart that needs treatment.  Electrical currents will be sent from the catheter to ablate heart tissue in desired areas. There are three types of energy that may be used to ablate heart tissue: ? Heat (radiofrequency energy). ? Laser energy. ? Extreme cold (cryoablation).  When the necessary tissue has been ablated, the catheter will be removed.  Pressure will be held on the catheter insertion area to prevent excessive bleeding.  A bandage (dressing) will be placed over the catheter insertion area. The procedure may vary among health care providers and hospitals. What happens after the procedure?  Your blood pressure, heart rate, breathing rate, and blood oxygen level will be monitored until the medicines you were given have worn off.  Your catheter insertion area will be monitored for bleeding. You will need to lie still for a few hours to ensure that you do not bleed from the catheter insertion area.  Do not drive for 24 hours or as long as directed by your health care provider. Summary  Cardiac ablation is a procedure to disable (ablate) a small amount of heart tissue in very specific places. Ablating some of the problem areas can improve the heart rhythm or return it to normal.  During the procedure, electrical currents will be sent from the catheter to ablate heart tissue in desired areas. This information is not intended to  replace advice given to you by your health care provider. Make sure you discuss any questions you have with your health care provider. Document Revised: 01/24/2018 Document Reviewed: 06/22/2016 Elsevier Patient Education  2020 [employer]

## 2020-02-09 ENCOUNTER — Other Ambulatory Visit: Payer: Self-pay

## 2020-02-09 ENCOUNTER — Ambulatory Visit (INDEPENDENT_AMBULATORY_CARE_PROVIDER_SITE_OTHER): Payer: Managed Care, Other (non HMO)

## 2020-02-09 DIAGNOSIS — I471 Supraventricular tachycardia: Secondary | ICD-10-CM

## 2020-03-13 ENCOUNTER — Other Ambulatory Visit: Payer: Self-pay

## 2020-03-13 ENCOUNTER — Encounter: Payer: Self-pay | Admitting: Internal Medicine

## 2020-03-13 ENCOUNTER — Ambulatory Visit: Payer: Managed Care, Other (non HMO) | Admitting: Internal Medicine

## 2020-03-13 VITALS — BP 130/84 | HR 100 | Ht 63.0 in | Wt 146.0 lb

## 2020-03-13 DIAGNOSIS — I471 Supraventricular tachycardia: Secondary | ICD-10-CM

## 2020-03-13 NOTE — Progress Notes (Signed)
HPI Mrs. [place] is referred today by Dr. Virginia Rochester for evaluation of SVT. She is a pleasant middle aged woman with anxiety who has had recurrent episodes of adenosine sensitive SVT for years. She has been treated with beta blockers. She notes that she feels some fatigue with her beta blocker but that her heart racing appears to be better. She has some fatigue and worries that the beta blocker may not be safe in the long term. She denies chest pain  No Known Allergies   Current Outpatient Medications  Medication Sig Dispense Refill  . ascorbic acid (VITAMIN C) 500 MG tablet Take by mouth.    . ergocalciferol (VITAMIN D2) 1.25 MG (50000 UT) capsule Take by mouth.    Marland Kitchen ketorolac (ACULAR) 0.5 % ophthalmic solution Place 1 drop into the right eye 4 (four) times daily.    . metoprolol succinate (TOPROL-XL) 25 MG 24 hr tablet Take 1 tablet (25 mg total) by mouth daily. 30 tablet 6  . Multiple Vitamin (MULTI-VITAMIN) tablet Take by mouth.    . omega-3 acid ethyl esters (LOVAZA) 1 g capsule Take by mouth daily.    . prednisoLONE acetate (PRED FORTE) 1 % ophthalmic suspension Place 1 drop into the right eye 4 (four) times daily.     No current facility-administered medications for this visit.     Past Medical History:  Diagnosis Date  . Heart murmur   . Heart palpitations   . Pregnancy induced hypertension   . Preterm labor   . PSVT (paroxysmal supraventricular tachycardia) (HCC)   . Seizures (HCC) 1994   No current hx; during the delivery of first child    ROS:   All systems reviewed and negative except as noted in the HPI.   Past Surgical History:  Procedure Laterality Date  . TONSILLECTOMY    . TUBAL LIGATION  05/19/2012   Procedure: POST PARTUM TUBAL LIGATION;  Surgeon: Allie Bossier, MD;  Location: [facility];  Service: Gynecology;  Laterality: Bilateral;     Family History  Problem Relation Age of Onset  . Hypertension Mother   . Cancer Mother        Cervical  .  Hypertension Father   . Anesthesia problems Neg Hx      Social History   Socioeconomic History  . Marital status: Married    Spouse name: Not on file  . Number of children: Not on file  . Years of education: Not on file  . Highest education level: Not on file  Occupational History  . Not on file  Tobacco Use  . Smoking status: Former Smoker    Packs/day: 0.25    Years: 10.00    Pack years: 2.50    Quit date: 08/31/2011    Years since quitting: 8.5  . Smokeless tobacco: Never Used  Substance and Sexual Activity  . Alcohol use: No  . Drug use: No  . Sexual activity: Yes    Birth control/protection: None    Comment: tubaligation  Other Topics Concern  . Not on file  Social History Narrative  . Not on file   Social Determinants of Health   Financial Resource Strain:   . Difficulty of Paying Living Expenses:   Food Insecurity:   . Worried About Programme researcher, broadcasting/film/video in the Last Year:   . Barista in the Last Year:   Transportation Needs:   . Freight forwarder (Medical):   Marland Kitchen Lack of Transportation (  Non-Medical):   Physical Activity:   . Days of Exercise per Week:   . Minutes of Exercise per Session:   Stress:   . Feeling of Stress :   Social Connections:   . Frequency of Communication with Friends and Family:   . Frequency of Social Gatherings with Friends and Family:   . Attends Religious Services:   . Active Member of Clubs or Organizations:   . Attends Banker Meetings:   Marland Kitchen Marital Status:   Intimate Partner Violence:   . Fear of Current or Ex-Partner:   . Emotionally Abused:   Marland Kitchen Physically Abused:   . Sexually Abused:      BP (!) 130/84   Pulse 100   Ht 5\' 3"  (1.6 m)   Wt 146 lb (66.2 kg)   SpO2 97%   BMI 25.86 kg/m   Physical Exam:  Well appearing 49 yo woman, NAD HEENT: Unremarkable Neck:  No JVD, no thyromegally Lymphatics:  No adenopathy Back:  No CVA tenderness Lungs:  Clear with no wheezes HEART:  Regular rate  rhythm, no murmurs, no rubs, no clicks Abd:  soft, positive bowel sounds, no organomegally, no rebound, no guarding Ext:  2 plus pulses, no edema, no cyanosis, no clubbing Skin:  No rashes no nodules Neuro:  CN II through XII intact, motor grossly intact  EKG - nsr with no pre-excitation   Assess/Plan: 1. SVT - I have discussed the treatment options with the patient and the risks/benefits/goals/expectations of catheter ablation were reviewed. She will call [phone] if she would like to undergo ablation.  [place].D.

## 2020-03-13 NOTE — Patient Instructions (Addendum)
Medication Instructions:  Your physician recommends that you continue on your current medications as directed. Please refer to the Current Medication list given to you today.  *If you need a refill on your cardiac medications before your next appointment, please call your pharmacy*  Lab Work: None ordered.  If you have labs (blood work) drawn today and your tests are completely normal, you will receive your results only by: Marland Kitchen MyChart Message (if you have MyChart) OR . A paper copy in the mail If you have any lab test that is abnormal or we need to change your treatment, we will call you to review the results.  Testing/Procedures: None ordered.  The following dates are available Aug. 10, 16,19,25,27,30 Sept 2,8,10,13,17,23,24,27, 29   Please call [phone] when you are ready to schedule Other Instructions:

## 2020-06-17 ENCOUNTER — Telehealth: Payer: Self-pay | Admitting: Cardiology

## 2020-06-17 NOTE — Telephone Encounter (Signed)
Fine with me

## 2020-06-17 NOTE — Telephone Encounter (Signed)
Patient upset claiming that Dr Kirke Corin is her doctor and wanting to schedule with him  Explained that she has not seen Dr Kirke Corin since 2018 and Dr Azucena Cecil is now her doctor  Patient disagrees and would like to be switched  Per policy both doctors must agree with change  Please advise

## 2020-06-21 ENCOUNTER — Ambulatory Visit: Payer: Managed Care, Other (non HMO) | Admitting: Cardiovascular Disease

## 2020-06-21 ENCOUNTER — Encounter: Payer: Self-pay | Admitting: Cardiovascular Disease

## 2020-06-21 ENCOUNTER — Other Ambulatory Visit: Payer: Self-pay

## 2020-06-21 VITALS — BP 112/76 | HR 89 | Ht 63.0 in | Wt 153.4 lb

## 2020-06-21 DIAGNOSIS — I471 Supraventricular tachycardia: Secondary | ICD-10-CM | POA: Diagnosis not present

## 2020-06-21 DIAGNOSIS — Z720 Tobacco use: Secondary | ICD-10-CM

## 2020-06-21 MED ORDER — METOPROLOL SUCCINATE ER 25 MG PO TB24: 25.0000 mg | ORAL_TABLET | Freq: Every day | ORAL | 3 refills | Status: AC

## 2020-06-21 NOTE — Patient Instructions (Signed)
Medication Instructions:  - Your physician recommends that you continue on your current medications as directed. Please refer to the Current Medication list given to you today.  *If you need a refill on your cardiac medications before your next appointment, please call your pharmacy*   Lab Work: - none ordered  If you have labs (blood work) drawn today and your tests are completely normal, you will receive your results only by: . MyChart Message (if you have MyChart) OR . A paper copy in the mail If you have any lab test that is abnormal or we need to change your treatment, we will call you to review the results.   Testing/Procedures: - none ordered   Follow-Up: At [facility], you and your health needs are our priority.  As part of our continuing mission to provide you with exceptional heart care, we have created designated Provider Care Teams.  These Care Teams include your primary Cardiologist (physician) and Advanced Practice Providers (APPs -  Physician Assistants and Nurse Practitioners) who all work together to provide you with the care you need, when you need it.  We recommend signing up for the patient portal called "MyChart".  Sign up information is provided on this After Visit Summary.  MyChart is used to connect with patients for Virtual Visits (Telemedicine).  Patients are able to view lab/test results, encounter notes, upcoming appointments, etc.  Non-urgent messages can be sent to your provider as well.   To learn more about what you can do with MyChart, go to [email or web address].    Your next appointment:   6 month(s)  The format for your next appointment:   In Person  Provider:   You may see Muhammad Arida, MD or one of the following Advanced Practice Providers on your designated Care Team:    Christopher Berge, NP  Ryan Dunn, PA-C  Jacquelyn Visser, PA-C  Cadence Furth, PA-C    Other Instructions n/a  

## 2020-06-21 NOTE — Progress Notes (Signed)
Cardiology Office Note   Date:  06/21/2020   ID:  [name], DOB 1970-10-01, MRN [id number]  PCP:  [facility]  Cardiologist:   Lorine Bears, MD   Chief Complaint  Patient presents with  . other    Est. care for SVT and needs medication refills. Meds reviewed by the pt. verbally. "doing well."       History of Present Illness: Connie Greer is a 49 y.o. female who presents for A follow-up visit regarding paroxysmal supraventricular tachycardia. She was seen by me in June 2013 for paroxysmal supraventricular tachycardia which responded to adenosine given by EMS. The episode happened during pregnancy. She underwent an echocardiogram which was normal. CTA showed no evidence of pulmonary embolism. She had intermittent episodes of SVT since then but not frequent enough to require ablation.   She smokes about half a pack per week. She reports being under significant stress lately especially at work. She is a Electrical engineer.   Most recent episode was in May of this year and required termination with adenosine.  Echocardiogram in June showed normal LV systolic function, mildly dilated left atrium and no significant valvular abnormalities.  She was seen by Dr. Ladona Ridgel in July to discuss ablation.  She had concerns about the risk of the procedure and also reports improvement in symptoms she resumed Toprol.  She has not had any prolonged episodes since then.  She feels well.  She is currently working at [employer].  Past Medical History:  Diagnosis Date  . Heart murmur   . Heart palpitations   . Pregnancy induced hypertension   . Preterm labor   . PSVT (paroxysmal supraventricular tachycardia) (HCC)   . Seizures (HCC) 1994   No current hx; during the delivery of first child    Past Surgical History:  Procedure Laterality Date  . TONSILLECTOMY    . TUBAL LIGATION  05/19/2012   Procedure: POST PARTUM TUBAL LIGATION;  Surgeon: Allie Bossier, MD;  Location: [facility];  Service: Gynecology;  Laterality: Bilateral;     Current Outpatient Medications  Medication Sig Dispense Refill  . ascorbic acid (VITAMIN C) 500 MG tablet Take by mouth.    . ergocalciferol (VITAMIN D2) 1.25 MG (50000 UT) capsule Take by mouth.    Marland Kitchen ketorolac (ACULAR) 0.5 % ophthalmic solution Place 1 drop into the right eye 4 (four) times daily.    . metoprolol succinate (TOPROL-XL) 25 MG 24 hr tablet Take 1 tablet (25 mg total) by mouth daily. 90 tablet 3  . Multiple Vitamin (MULTI-VITAMIN) tablet Take by mouth.    . omega-3 acid ethyl esters (LOVAZA) 1 g capsule Take by mouth daily.    . prednisoLONE acetate (PRED FORTE) 1 % ophthalmic suspension Place 1 drop into the right eye 4 (four) times daily.     No current facility-administered medications for this visit.    Allergies:   Patient has no known allergies.    Social History:  The patient  reports that she quit smoking about 8 years ago. She has a 2.50 pack-year smoking history. She has never used smokeless tobacco. She reports that she does not drink alcohol and does not use drugs.   Family History:  The patient's family history includes Cancer in her mother; Hypertension in her father and mother.    ROS:  Please see the history of present illness.   Otherwise, review of systems are positive for none.   All other systems are reviewed  and negative.    PHYSICAL EXAM: VS:  BP 112/76 (BP Location: Right Arm, Patient Position: Sitting, Cuff Size: Normal)   Pulse 89   Ht 5\' 3"  (1.6 m)   Wt 153 lb 6 oz (69.6 kg)   SpO2 98%   BMI 27.17 kg/m  , BMI Body mass index is 27.17 kg/m. GEN: Well nourished, well developed, in no acute distress  HEENT: normal  Neck: no JVD, carotid bruits, or masses Cardiac: RRR; no murmurs, rubs, or gallops,no edema  Respiratory:  clear to auscultation bilaterally, normal work of breathing GI: soft, nontender, nondistended, + BS MS: no deformity or atrophy  Skin: warm and dry, no rash Neuro:   Strength and sensation are intact Psych: euthymic mood, full affect   EKG:  EKG is ordered today. The ekg ordered today demonstrates  normal sinus rhythm with no significant ST or T wave changes.   Recent Labs: No results found for requested labs within last 8760 hours.    Lipid Panel No results found for: CHOL, TRIG, HDL, CHOLHDL, VLDL, LDLCALC, LDLDIRECT    Wt Readings from Last 3 Encounters:  06/21/20 153 lb 6 oz (69.6 kg)  03/13/20 146 lb (66.2 kg)  12/29/19 146 lb (66.2 kg)        PAD Screen 10/01/2016  Previous PAD dx? No  Previous surgical procedure? No  Pain with walking? No  Feet/toe relief with dangling? No  Painful, non-healing ulcers? No  Extremities discolored? No      ASSESSMENT AND PLAN:  1.  Paroxysmal supraventricular tachycardia: She reports improvement in symptoms since she resumed taking Toprol.  We again discussed vagal maneuvers.  She is going to continue monitoring her symptoms for now and if the episodes become more frequent, she is open to discussing ablation again.    2.  Tobacco use: This was not addressed during today's visit.    Disposition:   FU with me in 6 months  Signed,  10/03/2016, MD  06/21/2020 4:54 PM    [facility]

## 2022-03-25 ENCOUNTER — Telehealth: Payer: Self-pay | Admitting: Cardiovascular Disease

## 2022-03-25 NOTE — Telephone Encounter (Signed)
Changed providers deleting recall
